# Patient Record
Sex: Male | Born: 1965 | Race: White | Hispanic: No | Marital: Married | Smoking: Never smoker
Health system: Southern US, Community
[De-identification: ages and names within clinical notes are randomized; demographics above are authoritative.]

## PROBLEM LIST (undated history)

## (undated) DIAGNOSIS — H9319 Tinnitus, unspecified ear: Secondary | ICD-10-CM

## (undated) HISTORY — PX: BACK SURGERY: SHX140

## (undated) HISTORY — PX: SHOULDER ARTHROSCOPY WITH LABRAL REPAIR: SHX5691

## (undated) HISTORY — PX: KNEE SURGERY: SHX244

## (undated) HISTORY — PX: SHOULDER SURGERY: SHX246

---

## 2002-12-15 ENCOUNTER — Emergency Department (HOSPITAL_COMMUNITY): Admission: EM | Admit: 2002-12-15 | Discharge: 2002-12-15 | Payer: Self-pay | Admitting: Emergency Medicine

## 2006-11-17 ENCOUNTER — Ambulatory Visit (HOSPITAL_COMMUNITY): Admission: RE | Admit: 2006-11-17 | Discharge: 2006-11-17 | Payer: Self-pay | Admitting: Internal Medicine

## 2007-07-19 HISTORY — PX: CHEST TUBE INSERTION: SHX231

## 2007-10-27 ENCOUNTER — Inpatient Hospital Stay (HOSPITAL_COMMUNITY): Admission: EM | Admit: 2007-10-27 | Discharge: 2007-11-07 | Payer: Self-pay | Admitting: Emergency Medicine

## 2007-10-27 ENCOUNTER — Ambulatory Visit: Payer: Self-pay | Admitting: Internal Medicine

## 2007-10-27 ENCOUNTER — Ambulatory Visit: Payer: Self-pay | Admitting: Pulmonary Disease

## 2007-10-29 ENCOUNTER — Ambulatory Visit: Payer: Self-pay | Admitting: Thoracic Surgery

## 2007-10-29 ENCOUNTER — Ambulatory Visit: Payer: Self-pay | Admitting: Critical Care Medicine

## 2007-10-30 ENCOUNTER — Encounter (INDEPENDENT_AMBULATORY_CARE_PROVIDER_SITE_OTHER): Payer: Self-pay | Admitting: Interventional Radiology

## 2007-11-08 ENCOUNTER — Inpatient Hospital Stay (HOSPITAL_COMMUNITY): Admission: AD | Admit: 2007-11-08 | Discharge: 2007-11-21 | Payer: Self-pay | Admitting: Internal Medicine

## 2007-11-10 ENCOUNTER — Encounter: Payer: Self-pay | Admitting: Thoracic Surgery

## 2007-11-28 ENCOUNTER — Ambulatory Visit: Payer: Self-pay | Admitting: Thoracic Surgery

## 2007-11-28 ENCOUNTER — Encounter: Admission: RE | Admit: 2007-11-28 | Discharge: 2007-11-28 | Payer: Self-pay | Admitting: Thoracic Surgery

## 2007-12-20 ENCOUNTER — Ambulatory Visit: Payer: Self-pay | Admitting: Thoracic Surgery

## 2007-12-20 ENCOUNTER — Encounter: Admission: RE | Admit: 2007-12-20 | Discharge: 2007-12-20 | Payer: Self-pay | Admitting: Thoracic Surgery

## 2008-01-17 ENCOUNTER — Encounter: Admission: RE | Admit: 2008-01-17 | Discharge: 2008-01-17 | Payer: Self-pay | Admitting: Thoracic Surgery

## 2008-01-17 ENCOUNTER — Ambulatory Visit: Payer: Self-pay | Admitting: Thoracic Surgery

## 2008-03-25 ENCOUNTER — Encounter: Admission: RE | Admit: 2008-03-25 | Discharge: 2008-03-25 | Payer: Self-pay | Admitting: Thoracic Surgery

## 2008-03-25 ENCOUNTER — Ambulatory Visit: Payer: Self-pay | Admitting: Thoracic Surgery

## 2008-11-09 IMAGING — CR DG CHEST 1V PORT
1 series · 1 of 1 positions shown · non-contrast
Comparison: 11/14/2007 study

CLINICAL DATA: Pneumonia

PORTABLE CHEST - 1 VIEW

[view not recorded]
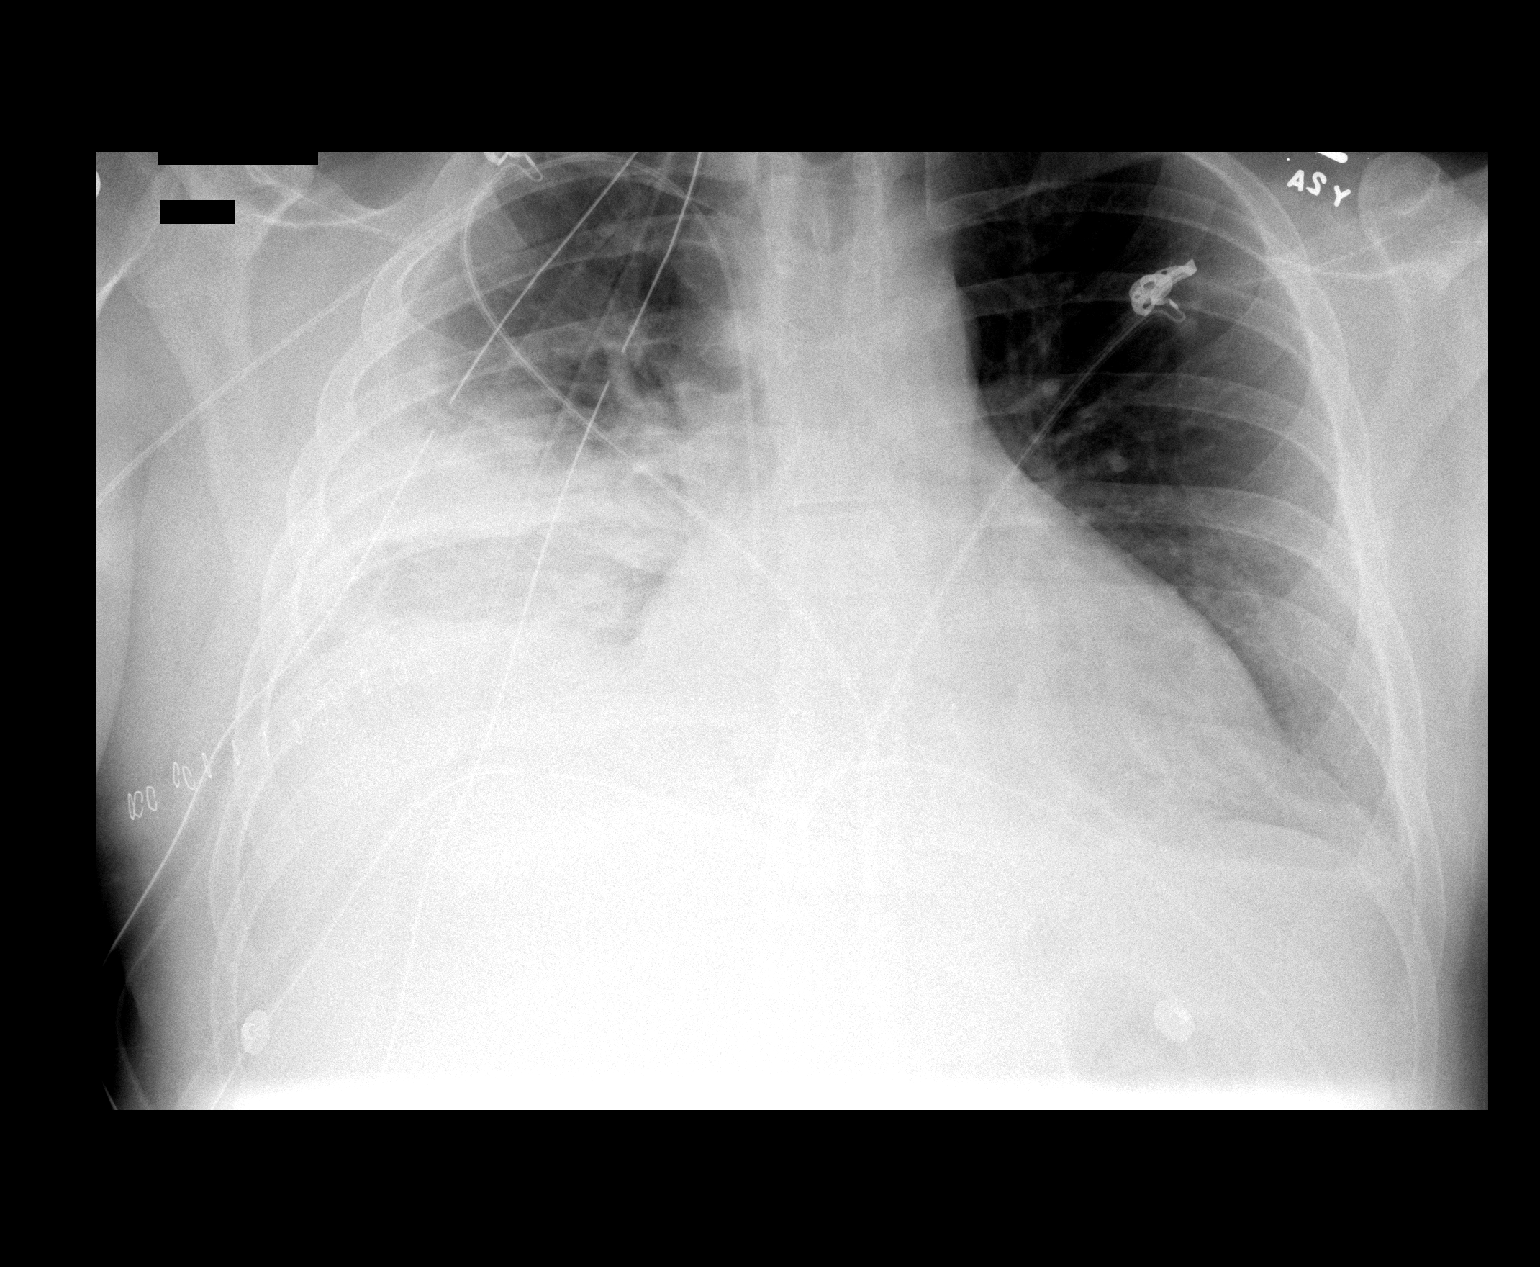

[1 of 1 positions shown; findings below may reference images not displayed]

FINDINGS: The right arm PICC line is in place with its tip in the
superior vena cava.
There are two right chest tubes in place..  No pneumothorax is
evident.  Atelectasis, airspace disease and associated pleural
density are again seen in the inferior right hemithorax.  There is
slightly more aeration of the right lung base than on the previous
study.

The left lung appears free of infiltrates.  There is moderate
enlargement of the cardiac silhouette.  Bones appear normal for
age.
IMPRESSION: There are two right chest tubes in place as well as the PICC line.
No pneumothorax is evident.

There is continued atelectasis with air space disease and opacity
and associated pleural density in the inferior right hemithorax.
There is slightly more aeration of the right lung base than on the
previous study.

## 2008-11-11 IMAGING — CR DG CHEST 1V PORT
1 series · 1 of 1 positions shown · non-contrast
Comparison: 11/16/2007

CLINICAL DATA: Pneumonia

PORTABLE CHEST - 1 VIEW

[view not recorded]
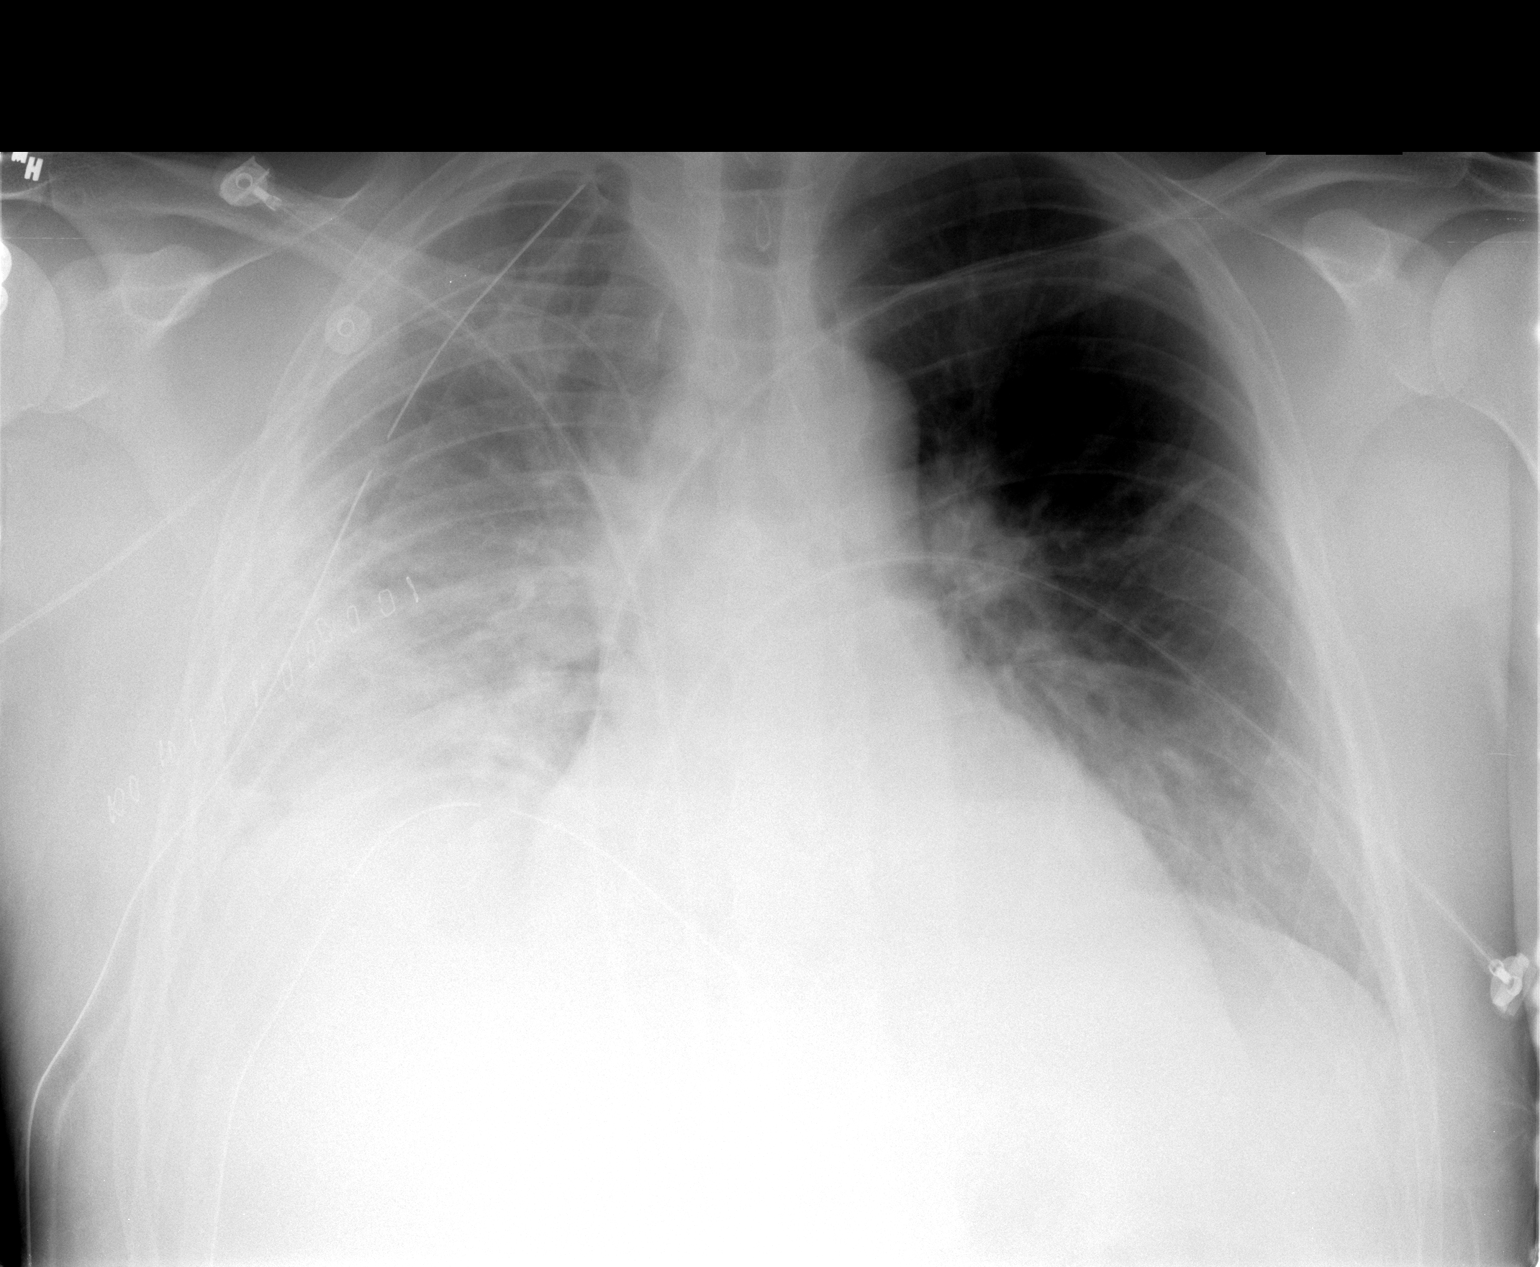

[1 of 1 positions shown; findings below may reference images not displayed]

FINDINGS: One of the right chest tubes has been removed with to
left in place.  The right PICC is stable.  Bilateral pulmonary
opacities and low lung volumes are stable.  The heart is normal in
size.  A tiny right apical pneumothorax is present.  It is less
than 5%.
IMPRESSION: One chest tube on the right has been removed and there is a tiny
right apical pneumothorax.

Bilateral pulmonary opacity is unchanged.

## 2008-11-12 IMAGING — CR DG CHEST 1V PORT
1 series · 1 of 1 positions shown · non-contrast
Comparison: 11/17/2007

CLINICAL DATA: Right lung pneumonia and status post thoracotomy for
empyema

PORTABLE CHEST - 1 VIEW

[view not recorded]
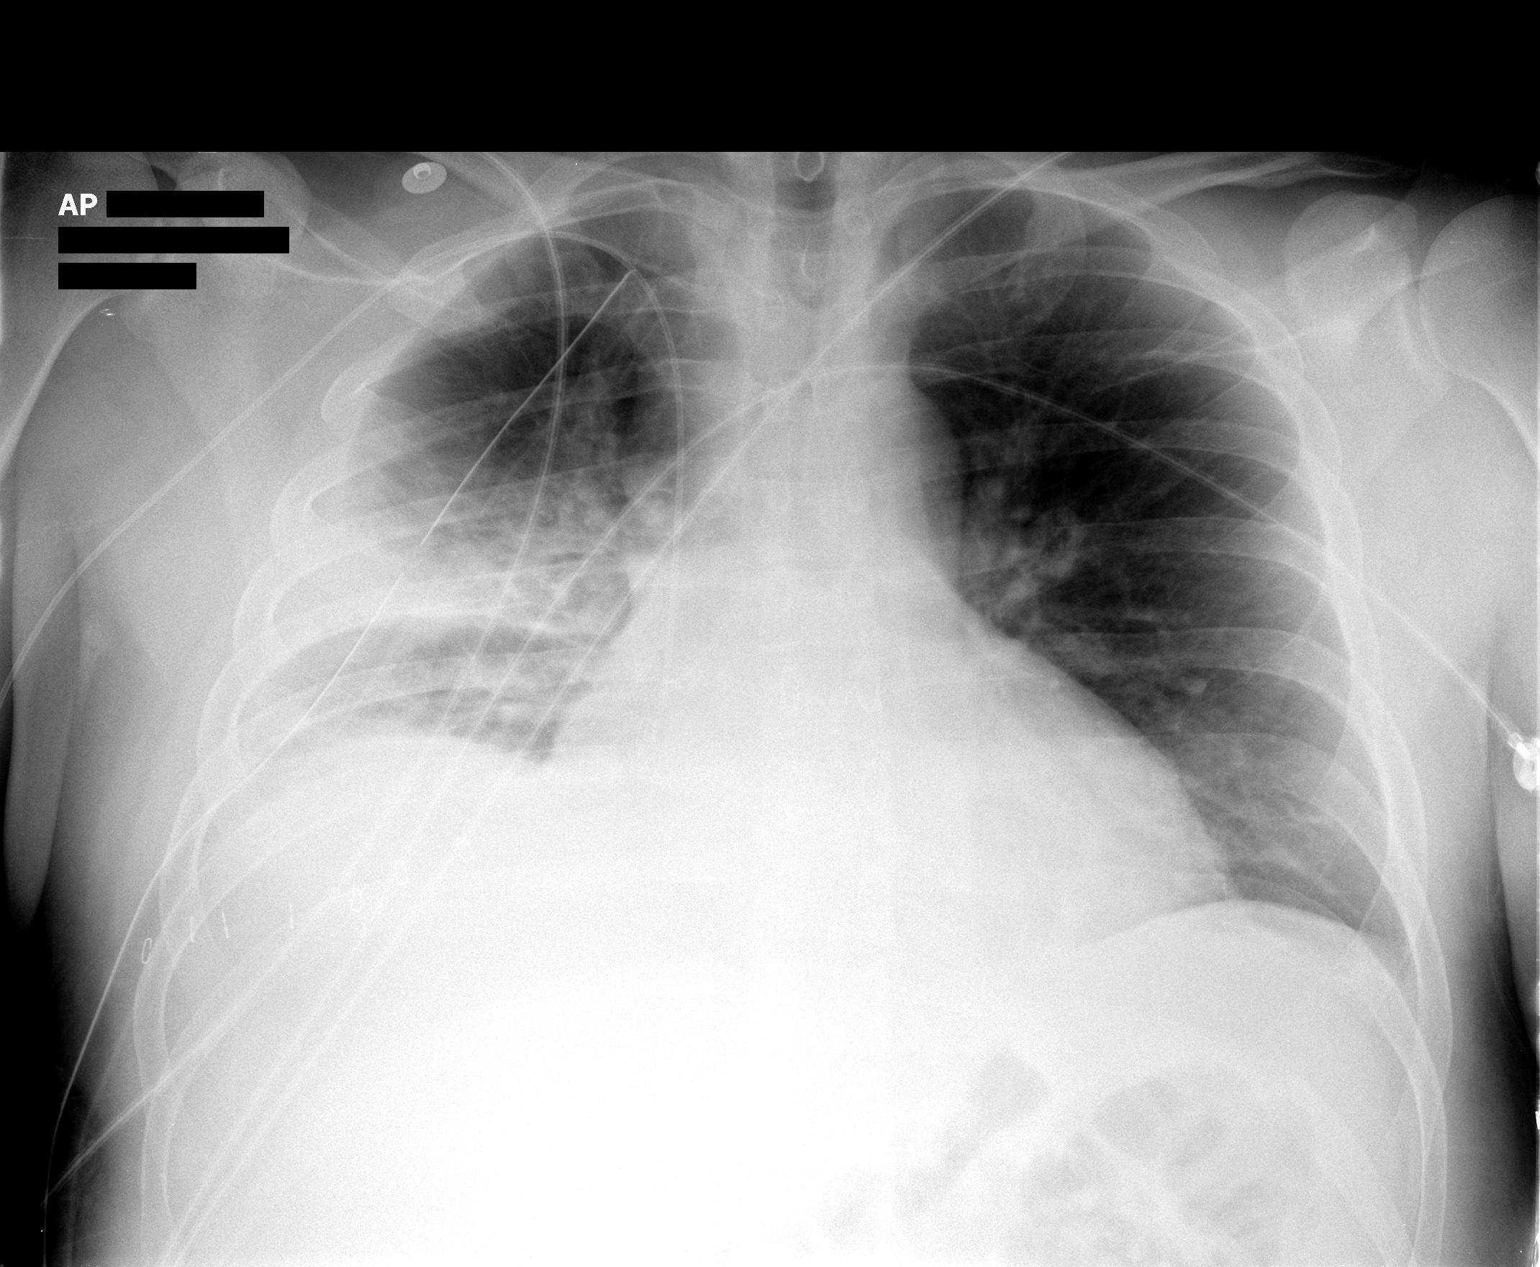

[1 of 1 positions shown; findings below may reference images not displayed]

FINDINGS: Single right chest tube remains in place.  No
pneumothorax visualized.  Overall aeration of the right lung has
improved substantially since the prior study.  There remains
component of pneumonia in the right mid lung field.  Stable heart
size.
IMPRESSION: Significant improvement in right lung aeration.  Residual right mid
lung infiltrate.  No pneumothorax

## 2008-11-14 IMAGING — CR DG CHEST 2V
2 series · 2 of 2 positions shown · non-contrast
Comparison: 11/19/2007

CLINICAL DATA: Weakness, shortness of breath

CHEST - 2 VIEW

[view not recorded (1 of 2)]
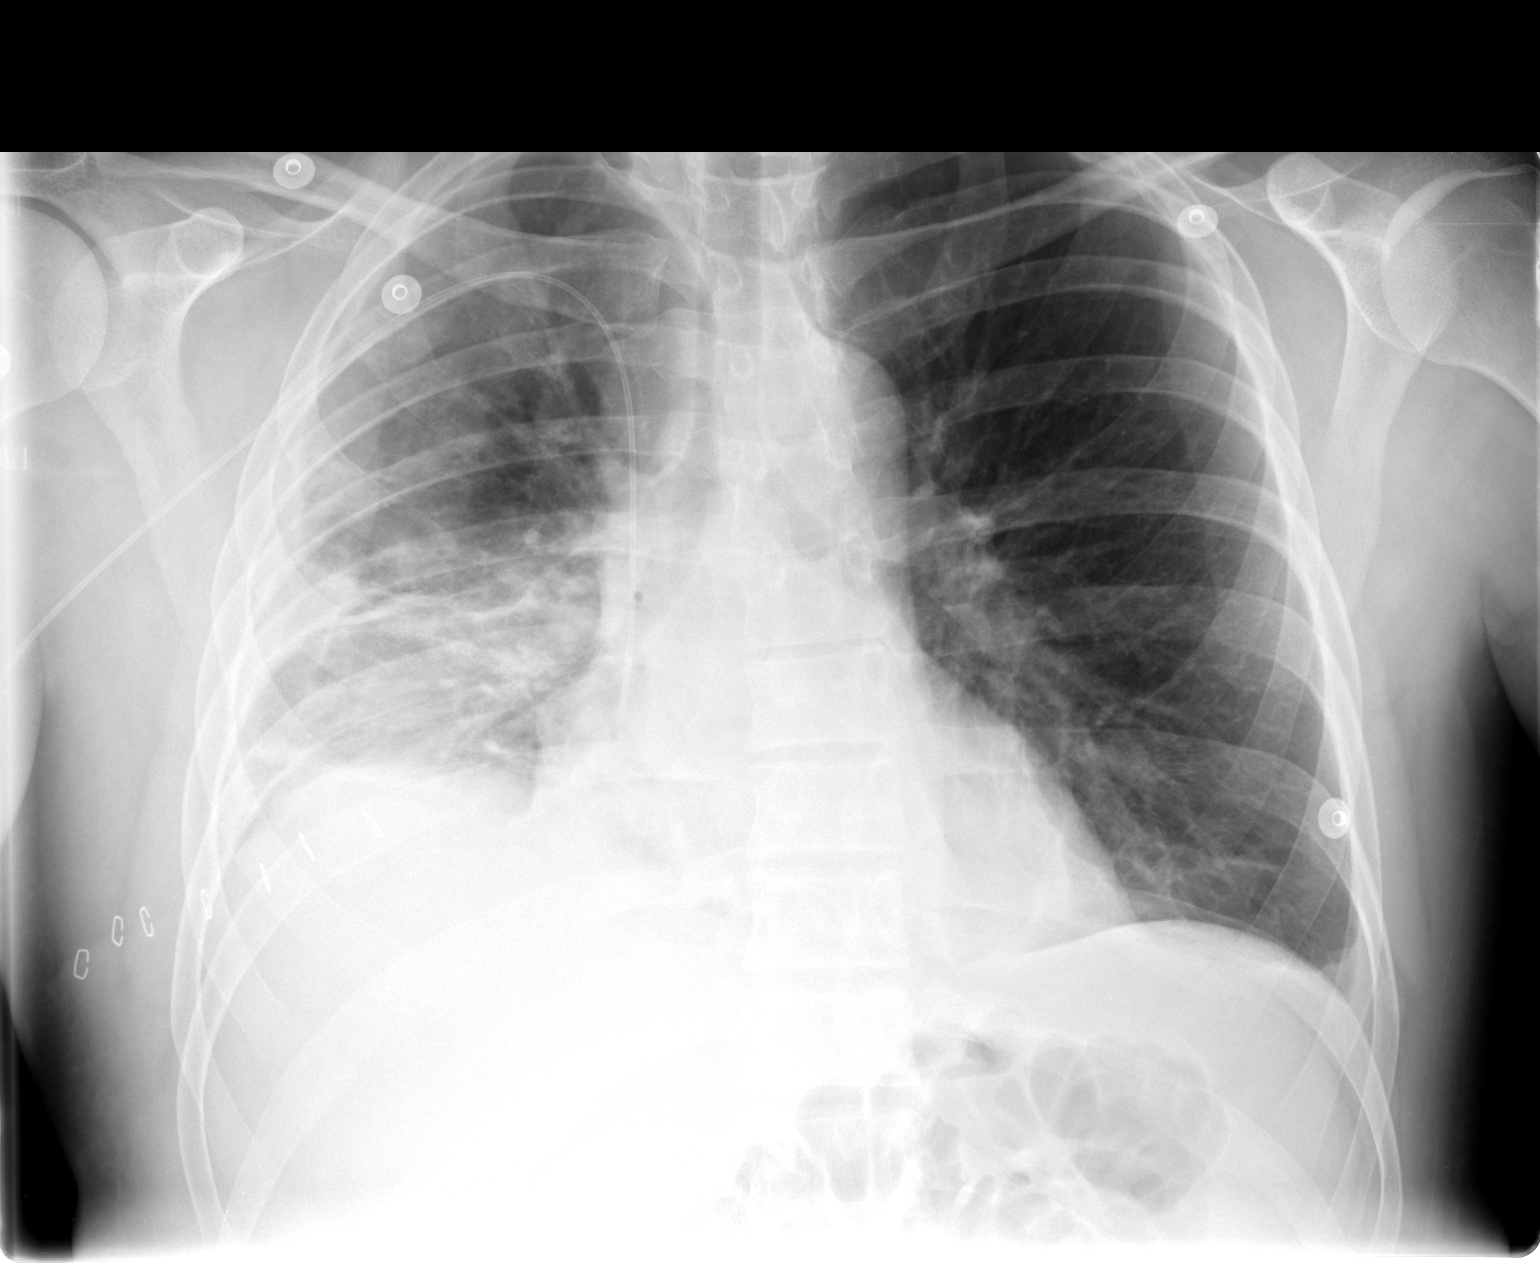

[view not recorded (2 of 2)]
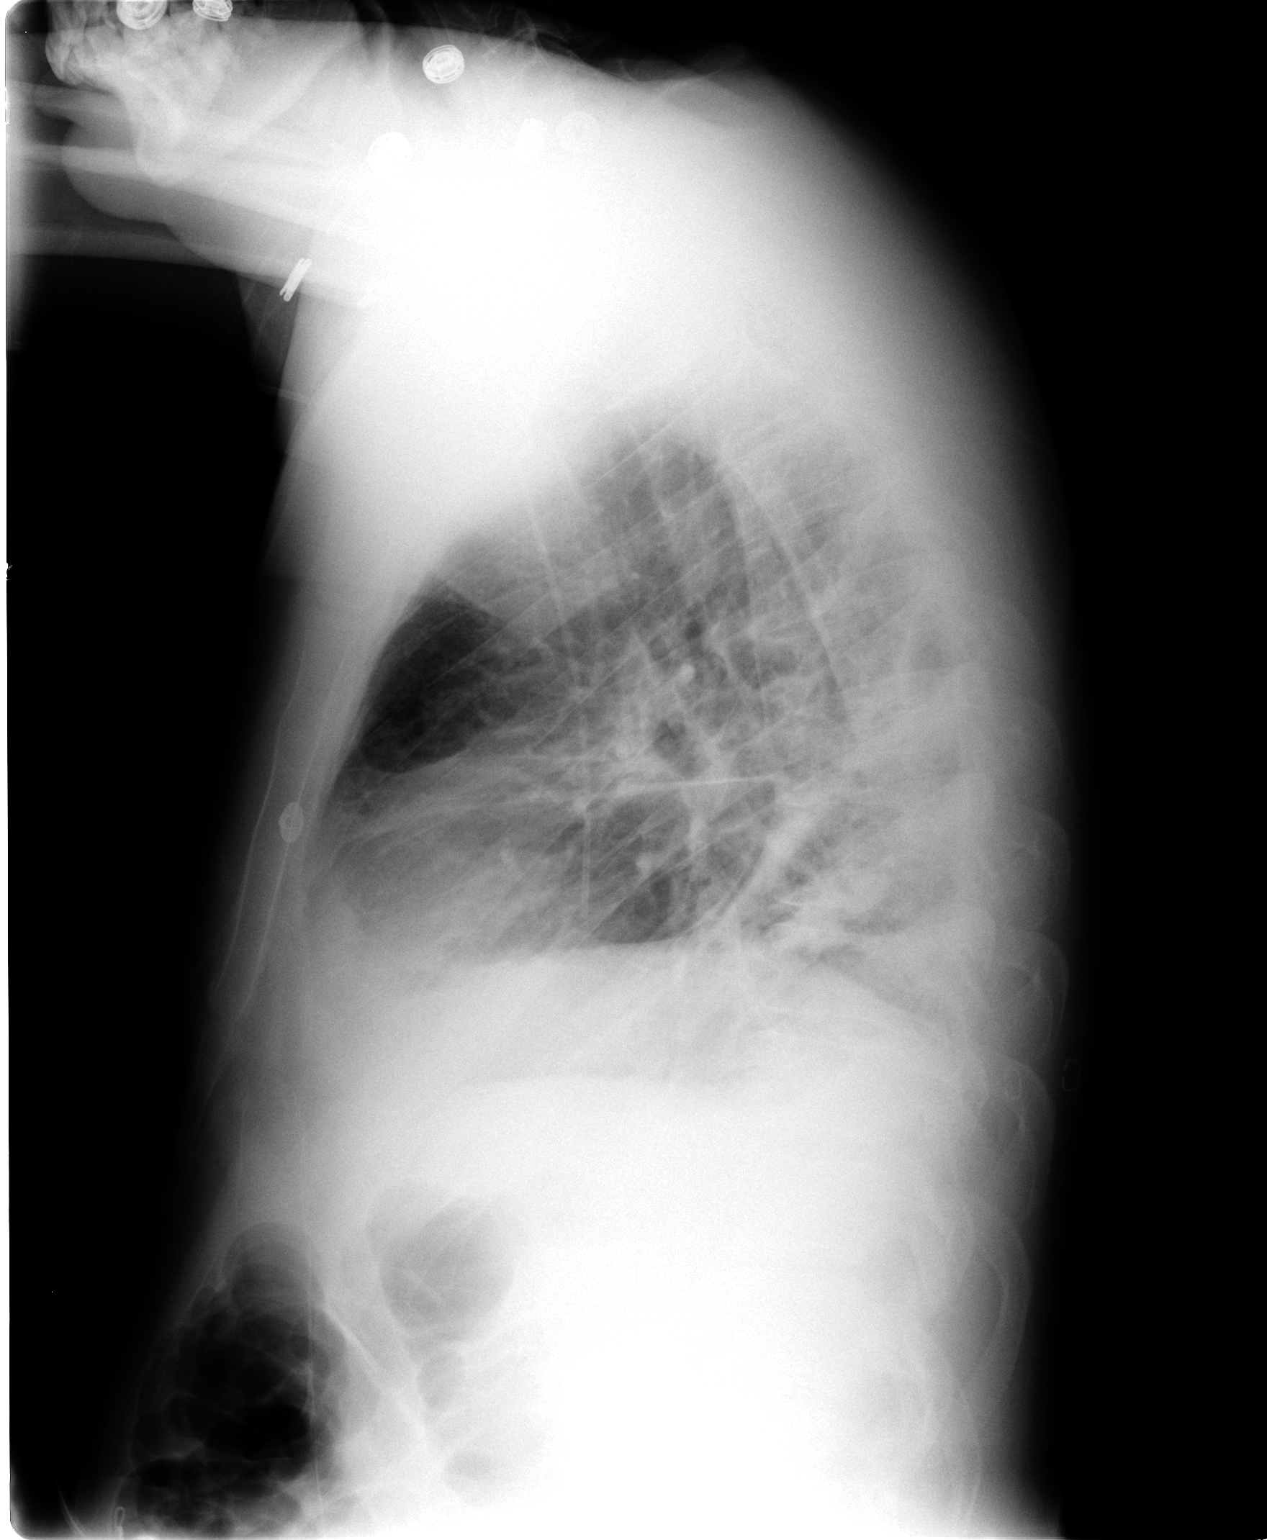

[2 of 2 positions shown; findings below may reference images not displayed]

FINDINGS: There is little change in pleural and parenchymal opacity
on the right with some volume loss on the right.  A small left
pleural effusion is present.  Heart size is stable.  Right central
venous catheter tip is unchanged with the tip in the lower SVC.
Only a small amount of right pleural air remains.
IMPRESSION: No significant change in pleural and parenchymal opacity on the
right with small amount of pleural air overlying  the right lung
apex.

## 2010-08-08 ENCOUNTER — Encounter: Payer: Self-pay | Admitting: Thoracic Surgery

## 2010-08-08 ENCOUNTER — Encounter: Payer: Self-pay | Admitting: Internal Medicine

## 2010-11-30 NOTE — H&P (Signed)
NAME:  ESTLE, HUGULEY        ACCOUNT NO.:  [id number]   MEDICAL RECORD NO.:  [id number]          PATIENT TYPE:  INP   LOCATION:  0112                         FACILITY:  [facility]   PHYSICIAN:  Tera Mater. Evlyn Kanner, M.D. DATE OF BIRTH:  22-Apr-1966   DATE OF ADMISSION:  10/27/2007  DATE OF DISCHARGE:                              HISTORY & PHYSICAL   HISTORY:  Mr. House is a healthy 45 year old white male who presents  to the emergency room for evaluation.  His story starts back when he was  ill on March 1 and saw Dr. Eric Form for difficulties with sinusitis.  He was treated with a course of Levaquin, and got mostly better.  His  right ear remained clogged up, however, and he saw a local ENT he  received 10-14 days of oral prednisone in a taper.  This did resolve in  complete resolution of his symptoms.  The patient was doing well in the  interim.  He did have a work-related trip to the [place] area, and  returned over the weekend, last weekend.  Sunday he felt less well than  usual, in sort of an ill/malaise kind of way, but nothing specific.  On  Monday he felt sick enough to miss work which is distinctly unusual, and  basically spent the day in bed with weakness and just malaise.   On Tuesday and Wednesday he developed more flu-like symptoms with aches,  pains, some fevers, some chills, and just generally feeling unwell and  stayed out of work both Tuesday and Wednesday as well.  On Thursday he  sought help at the local urgent care where he had previously been  treated as a patient, and was seen and apparently a flu swab was done  that was negative, and he was so treated for what they thought could be  oral thrush as a result of his prednisone therapy.  He was given  Tussionex, but his cough has gotten worse since that time.  Now he has  pleuritic pain.   Today, he has felt particularly worse.  He was weak with headache and  came to the emergency room for evaluation.  He has had the  travel to  [place], but no travel out of the country.  He has had no hunting trip, no  camping trips.  He had been on the soccer field some with his son, but  otherwise really has had no other difficulties.  His wife had had a  sinus infection.  His children have been well.  He has had no other  infectious like this.  His spleen is intact to his knowledge.  He  basically has no other past history of any sort.  At the present, he  does report his breath is a bit short.  His bowels have been working  fine with no nausea or diarrhea.  He just does still have some aches.  He did note some dysuria as well.   SOCIAL HISTORY:  The patient is married.  He works as an Neurosurgeon person.  He has two children.  He is a nonsmoker.   CURRENT MEDICATION  LIST INCLUDES:  Only the medicines given this week,  the Nystatin and Tussionex.  He takes nothing regularly.   ALLERGIES:  He has allergies to PENICILLINS which cause a rash.   PHYSICAL EXAMINATION:  VITAL SIGNS:  On exam today blood pressure is  126/88, pulse 100, respirations 18, temperature 97.8, O2 saturation 95%  on room air.  GENERAL:  We have a fairly well-appearing, well-nourished, white male  sitting up in no distress.  HEENT:  His sclerae are anicteric.  Fundus exam is intact with no  hemorrhages or lesions.  Tympanic membrane on the right is somewhat dull  and yellowish, but it is not really red.  The left side is clear.  Oral  mucous membranes are clear, and there is no thrush noted.  NECK:  Supple.  No thyromegaly or bruits are present.  LUNGS:  Reveal coarse dense rhonchi on the right with diminished breath  sounds.  There is a rub over there as well.  No wheezing is present.  HEART:  Regular and tachycardic with no murmur.  ABDOMEN: Soft, nontender.  I cannot feel any liver edge or any spleen  edge.  Distal pulses reveal strong pulses with no edema.  The patient is  awake.  He is alert.  His mentation is excellent.  Speech  is clear.  No  tremors present.  No skin rashes are present.   LAB DATA:  A chest x-ray shows opacity in the right lower lobe, right  middle lobe, possibly loculated effusion and pneumonia, but cannot  exclude empyema.  Other labs are markedly abnormal with a sodium 129,  potassium 3.7, chloride 89, CO2 22, BUN 43, creatinine 4.97, glucose  172.  Estimated GFR is 13, calcium is 7.7.  White count of 17,900,  hemoglobin 12.5, MCV 80.4, and platelets 250,000.   IN SUMMARY:  We have a 45 year old white male presenting with  significant acute illness with what appears to be a loculated pneumonia,  and perhaps full renal failure.  The BUN and creatinine suggest some  dehydration, but also possible renal process such as rhabdomyolysis and  a CPK is pending to look at this.  The potential for this patient to get  worse is very high, and broad-spectrum antibiotics will be given.  I  discussed the case briefly with Dr. Lina Sayre who is going to see  him.  He has already gotten Zithromax and Rocephin.  We had talked about  giving him Zosyn, but he has been PENICILLIN allergic, and I am going to  contact him back.  He is also getting vancomycin.  I do not see any  indication for coverage with fungal medicines.  We are going to give him  hydration.  We are going to recheck cultures.  We are going to recheck  an ultrasound of the kidneys to rule out hydronephrosis.  At the present  time, I do not see any reason to get renal involved.  This was all  discussed with the patient and his wife.  The patient also has had  abnormal blood sugars probably stress-induced, but ketones will be  checked.  I do not think that this is a presentation of new onset type 1  diabetes, but there is a small potential.  He will be covered with a  sliding scale testing just in case.  His labs will be followed  carefully.  He will be in step-down unit.  He will be monitored, and  supplemental oxygen will be given,  and we  will watch him very carefully.           ______________________________  Tera Mater Evlyn Kanner, M.D.     SAS/MEDQ  D:  10/27/2007  T:  10/27/2007  Job:  [id number]

## 2010-11-30 NOTE — Discharge Summary (Signed)
Nicholas Murray, Nicholas Murray        ACCOUNT NO.:  [id number]   MEDICAL RECORD NO.:  [id number]          PATIENT TYPE:  INP   LOCATION:  2037                         FACILITY:  [facility]   PHYSICIAN:  Kari Baars, M.D.  DATE OF BIRTH:  [birth year]   DATE OF ADMISSION:  11/08/2007  DATE OF DISCHARGE:  11/21/2007                               DISCHARGE SUMMARY   DISCHARGE DIAGNOSES:  1. Severe right community-acquired pneumonia with empyema, status post      VATS/decortication/right thoracotomy (November 10, 2007).  2. Urethral stricture, status post percutaneous suprapubic catheter      placement (November 10, 2007).  3. Peritonitis secondary to extravasated urine after suprapubic      catheter removal, status post exploratory laparotomy and      replacement of suprapubic catheter and urethral dilatation (November 15, 2007).  4. Anemia, multifactorial, secondary to blood loss and acute illness.  5. Acute renal failure secondary to acute tubular necrosis, resolved.  6. Leukopenia, resolved.  7. Malnutrition given severe protein calorie malnutrition.  8. Hyperlipidemia.  9. History of dysplastic moles.  10.Drug rash, likely secondary to fentanyl and morphine, resolved.   DISCHARGE MEDICATIONS:  1. Cipro 500 mg b.i.d. x6 days.  2. Zyvox 600 mg b.i.d. x6 days.  3. Nu-Iron 150 mg daily.  4. Flomax 0.4 mg nightly.  5. Vicodin 5/500 one to two q.6 hours p.r.n. pain (prescription given      last time).  6. Colace 100 mg b.i.d. p.r.n., stool softener.  7. MiraLax 17 g daily p.r.n., laxative.  8. Ambien 10 mg nightly p.r.n. insomnia.   HOSPITAL PROCEDURES:  1. CT of the chest without contrast (November 09, 2007), progressive      right lung pneumonia with increasing loculated effusion, most      consistent with empyema.  2. Right video-assisted thoracoscopic surgery, right thoracotomy, and      drainage of empyema with decortication (November 10, 2007, by Dr.      Karle Plumber).  3.  Cystoscopy and percutaneous suprapubic catheter insertion (November 10, 2007, by Dr. Larey Dresser).  4. Exploratory laparotomy with drainage of extraperitoneal      extravasated urine, exploration of bladder with antegrade      cystoscopy, and urethral dilatation and replacement of suprapubic      catheter (November 15, 2007, by Dr. Manus Rudd and Dr. Larey Dresser).  5. Renal ultrasound (November 15, 2007), moderate distention of urinary      bladder.  Normal size of the kidneys with no hydronephrosis or      hydroureter.  Fluid consistent with ascites in the right upper      quadrant and left lower quadrant.   HISTORY OF PRESENT ILLNESS:  For full details, please see dictated  history and physical by Dr. Eloise Harman.  Briefly, Nicholas Murray is a  previously healthy 45 year old white male who had a recent prolonged  hospitalization for severe right-sided pneumonia.  He presented  initially on October 27, 2007, with a 5-day history of flu-like symptoms  including myalgias, fever, cough, and  dyspnea.  He had temperature up to  103.  At that time, he was admitted to the ICU with right-sided severe  pneumonia and acute renal failure.  That hospitalization involved an  extensive evaluation for his renal failure including renal biopsy, which  was consistent with ATN.  He was treated with empiric vancomycin and  Avelox for his community-acquired pneumonia with clinical improvement.  He had improvement in his fever, shortness of breath, and cough.  However, his chest x-ray continued to show significant right-sided  infiltrate.  Serial CT scan showed improvement of the effusions, and  after extensive discussion with pulmonology, CT surgery, and infectious  disease, it was felt reasonable to discharge him home on IV vancomycin,  p.o. Avelox, with close outpatient followup for these effusions.  Unfortunately, after discharge the patient developed a temperature of  103 and was readmitted for  further evaluation.   HOSPITAL COURSE:  The patient was admitted to a medical bed.  Lateral  decubitus chest x-ray showed persistent loculated pleural effusions.  A  CT scan was obtained that showed progressive increase in the size of the  effusions.  Dr. Edwyna Shell reviewed the scans and felt that VATS with  decortication was necessary, and on November 10, 2007, he was taken to the  operating room for this purpose.  Intraoperative evaluation showed three  discrete loculated collections consistent with empyema with significant  inflammation and pleural thickening.  The VATS was converted to an open  thoracotomy with extensive decortication and three chest tube placement.  He tolerated this procedure well and the chest tubes were subsequently  pulled several days prior to admission with no significant pneumothorax  or complications.  Chest x-ray continued to show improvement in the  right-sided opacity and effusions.  Clinically, he has improved with  resolution of his cough, fever, and leukocytosis.   The patient was initially continued on vancomycin and Avelox for empiric  coverage of both MRSA and community-acquired organisms.  Infectious  disease was involved in his care.  However, postoperatively, he  developed a significant drug rash.  Due to the concern that this may  have been antibiotic related, his vancomycin was changed to Zyvox.  His  Avelox was initially changed to azithromycin.  This was ultimately  switched to Cipro on Nov 16, 2007, to cover both pulmonary and urinary  sources of infection.  He has continued to do well on this antibiotic  regimen.  In hindsight, his rash was likely due to the fentanyl and  morphine PCA and improved with change to Dilaudid.  His rash has  completely resolved at this time.  From an infectious disease  standpoint, he does appear to be improving.   While in the operating room for his VATS/thoracotomy, Foley catheter  placement was attempted.  This  was met with great resistance and urology  was consulted.  Cystoscopy was performed that showed a tight urethral  stricture.  Therefore, suprapubic catheter was placed in the operating  room.  On postoperative day 3, a voiding trial was attempted and the  suprapubic catheter was subsequently removed on November 14, 2007.  On that  evening and the following morning, he complained of severe acute lower  abdominal discomfort and difficulty urinating.  A bedside bladder scan  and a renal ultrasound did not show any significant urinary retention.  General surgery was consulted, and due to his acute abdomen, he was  taken emergently to the operating room for exploratory laparotomy.  This  did  show extensive urinary peritonitis due to a leak from the suprapubic  catheter site.  Dr. Vonita Moss and Dr. Corliss Skains performed this procedure and  Dr. Vonita Moss did an antegrade cystoscopy and urethral dilatation and the  suprapubic catheter was replaced.  A suprapubic catheter was also placed  following dilatation of the urethral stricture.  He had excellent  urinary output through both tubes following this procedure with rapid  resolution of his severe abdominal pain.  On the day prior to discharge,  his suprapubic catheter was plugged and both will remain in place for  outpatient followup with Dr. Vonita Moss.   At this point, the patient's pneumonia and empyemas have improved  significantly with continued improvement in his clinical appearance.  He  continues to have mild pleuritic-type chest pain related to his surgery.  His urinary issues have stabilized and outpatient followup has been  arranged.  He has remained afebrile and is tolerating full diet and  ambulating without complication.  At this point, he is stable for  discharge to home with very close outpatient followup.   DISCHARGE LABS:  On Nov 20, 2007, CBC showed a white count of 7.1,  hemoglobin 9.2, and platelets 661.  BMET significant for sodium  137,  potassium 3.5, chloride 100, bicarb 29, BUN 7, creatinine 1.2, and  glucose 98.   DISCHARGE INSTRUCTIONS:  He is instructed to call if he has increasing  pain, temperature above 101.5, worsening shortness of breath or cough.   HOSPITAL FOLLOWUP:  1. He will follow up with Dr. Vonita Moss for suprapubic catheter removal      on Nov 20, 2007.  He was instructed to remove his Foley catheter at      home on Nov 23, 2007, and to call with any complications.  2. He will follow up with Dr. Clelia Croft on Nov 27, 2007, and should have a      repeat CBC at that time.  3. He will follow up with Dr. Edwyna Shell on Nov 28, 2007, with a chest x-      ray prior to the visit.   DISCHARGE DIET:  No restrictions.   DISPOSITION:  To home.      Kari Baars, M.D.  Electronically Signed     WS/MEDQ  D:  11/21/2007  T:  11/21/2007  Job:  [id number]   cc:   Ines Bloomer, M.D.  Maretta Bees. Vonita Moss, M.D.  Cliffton Asters, M.D.

## 2010-11-30 NOTE — Op Note (Signed)
NAME:  Nicholas Murray, Nicholas Murray        ACCOUNT NO.:  [id number]   MEDICAL RECORD NO.:  [id number]          PATIENT TYPE:  INP   LOCATION:  3308                         FACILITY:  [facility]   PHYSICIAN:  Maretta Bees. Vonita Moss, M.D.DATE OF BIRTH:  29-Apr-1966   DATE OF PROCEDURE:  DATE OF DISCHARGE:                               OPERATIVE REPORT   PREOPERATIVE DIAGNOSES:  Urethral stricture and acute abdomen, rule out  urinary extravasation versus intraperitoneal disease.   POSTOPERATIVE DIAGNOSES:  Severe urethral stricture and extraperitoneal  extravasation of urine.   SURGEON:  Maretta Bees. Vonita Moss, MD   ASSISTANT:  Wilmon Arms. Tsuei, MD   ANESTHESIA:  General.   PROCEDURE:  Exploratory laparotomy and drainage of extraperitoneal  extravasated urine, exploration of bladder with antegrade cystoscopy and  urethral dilation and placement of suprapubic tube.   INDICATIONS:  This 45 year old gentleman has had significant pulmonary  disease and infection and underwent a thoracotomy last weekend and a  Foley catheter could not be inserted by Dr. Edwyna Shell and the staff, so I  was called to see him and cystoscoped him and found a false passage, I  could not get in a catheter from below, so I put in a percutaneous  suprapubic tube.  That catheter drained nicely for the last few days and  yesterday, he had a voiding trial and voided well with no significant  residual urine and his SP tube was removed.  This morning, I came by to  see him and he said he could not void and had a lot of pain and  pressure.  Bladder scans and bladder ultrasound revealed a partially  distended bladder, but he had a tender abdomen worrisome for  intraperitoneal disease or some other type of problems above and beyond  the distended bladder.  He was seen by Dr. Corliss Skains, and a KUB showed no  intraperitoneal air, but it was felt that exploration was necessary.   PROCEDURE:  The patient was brought to the operating room, placed in  supine position and lower abdominal vertical incision was opened up by  Dr. Corliss Skains and he dictated an intraperitoneal exploration finding no  disease, but in the meantime, the patient had a tremendous amount of  extraperitoneal suprapubic urine that was drained.  The bladder was  distended and was opened up in the midline.  There was no evidence of  any damage to the bladder and the puncture site could not even be  visualized.  There were no posterior bladder injuries.  I then used a  flexible cystoscope antegrade from above and went through the bladder  neck and prostate and into the deep bulbous urethra where I saw a  stricture.  I passed a Glidewire through the strictured opening, and  over the Glidewire, I passed an open-ended ureteral catheter and then  inserted a metal guidewire over which I dilated in the usual fashion  from the tip of the penis back towards the bladder using Heyman dilators  and I dilated him to 26-French.  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  Then, I used a 16-French Council catheter to drain the bladder, which  was  easily placed in the bladder over the indwelling guidewire, which  was then removed.  I then made a stab wound in the left side of the  bladder through which a 22-French, 5 mL Foley was used for a suprapubic  tube.  A pursestring suture was placed around the bladder at the  suprapubic tube entry site.  The bladder was then closed with running 0-  Vicryl and then imbricated with some 0-chromic catgut.  The SP tube was  then brought up to the abdominal wall and chromic catgut placed between  the bladder and the back of the abdominal wall to hold it in place.  Through the stab wound to the right of the incision, a Blake drain was  placed in the prevesical space for postoperative drainage.  At this  point,  bladder irrigation was clear, and Dr. Corliss Skains closed the abdominal wound,  dictated separately.  Both the suprapubic tube and Blake drains were   sutured in place with black silk.  The patient tolerated the procedure  well and there was no significant blood loss, and sponge, needle,  instrument count was correct.      Maretta Bees. Vonita Moss, M.D.  Electronically Signed     LJP/MEDQ  D:  11/15/2007  T:  11/16/2007  Job:  [id number]   cc:   Kari Baars, M.D.  Ines Bloomer, M.D.  Wilmon Arms. Tsuei, M.D.

## 2010-11-30 NOTE — Letter (Signed)
January 17, 2008   Kari Baars, M.D.  [address]  [place], [place] [address]   Re:  Nicholas Murray, Nicholas Murray                DOB:  [birth year]   Dear Gala Romney:   I saw the patient back today.  He is doing remarkably well.  He is  gaining the strength.  His blood pressure was 123/81, pulse is 90,  respirations 18, and sats were 98%.  His incision is well-healed.  Chest  x-ray did show a small amount of reaction on the right side.  He does  have some limitation in motion in his right shoulder and some weakness.  So, I think he needs to have some strengthening exercises for that.  He  thought his personal trainer could do it.  If not, I will need to refer  him for a formal physical therapy.  He should be able to slowly build  his strength back up after his surgery.  I will see him back in 2 months  with another chest x-ray.   Ines Bloomer, M.D.  Electronically Signed   DPB/MEDQ  D:  01/17/2008  T:  01/18/2008  Job:  [id number]

## 2010-11-30 NOTE — Consult Note (Signed)
NAME:  Nicholas Murray, Nicholas Murray        ACCOUNT NO.:  [id number]   MEDICAL RECORD NO.:  [id number]          PATIENT TYPE:  INP   LOCATION:  2308                         FACILITY:  [facility]   PHYSICIAN:  Maretta Bees. Vonita Moss, M.D.DATE OF BIRTH:  08-13-1965   DATE OF CONSULTATION:  11/10/2007  DATE OF DISCHARGE:                                 CONSULTATION   I was asked by Dr. Edwyna Shell to see this gentleman in regards to  intraoperative problems with inserting the Foley catheter.  This  gentleman is an otherwise healthy 45 year old gentleman who had  extensive right-sided pneumonia and pneumonia has developed significant  consolidation in his right lung and was scheduled for surgical  exploration and drainage of this area by Dr. Edwyna Shell who ended up  draining empyema and decorticating the lung.  This gentleman apparently  had no history of prostatitis or particular voiding problems.   He has been treated in the past for hyperlipidemia and dysplastic moles.   MEDICATIONS PRIOR TO HOSPITALIZATION:  Vancomycin, Avelox, Tessalon  Perles, Vicodin, Mucinex, hydrocodone syrup, albuterol, Colax, and  MiraLax.   ALLERGIES:  He is allergic to PENICILLIN.   He has had a wrist fracture x2.   FAMILY HISTORY:  His father has a family history of prostate cancer.   REVIEW OF SYSTEMS:  As noted in the hospital chart.   PHYSICAL EXAMINATION:  VITAL SIGNS:  As noted per anesthesia record.  The patient on examination was asleep.  ABDOMEN:  Soft and flat.  EXTERNAL GENITALIA:  Revealed normal penis, urethral meatus, scrotum,  testicles, and epididymis except for some blood per urethral meatus.  Perineum and anal sphincter tone was normal.  Prostate felt small and  benign.  No seminal vesicle tissue palpated.   IMPRESSION:  Inability to pass a Foley catheter.   PLAN:  We will plan to cystoscope him, and that note is dictated  separately.      Maretta Bees. Vonita Moss, M.D.  Electronically Signed     LJP/MEDQ  D:  11/10/2007  T:  11/10/2007  Job:  [id number]   cc:   Ines Bloomer, M.D.  Kari Baars, M.D.

## 2010-11-30 NOTE — Consult Note (Signed)
NAME:  Nicholas Murray, Nicholas Murray        ACCOUNT NO.:  [id number]   MEDICAL RECORD NO.:  [id number]          PATIENT TYPE:  INP   LOCATION:  2928                         FACILITY:  [facility]   PHYSICIAN:  Charlcie Cradle. Delford Field, MD, FCCPDATE OF BIRTH:  [birth year]-Sep-1965   DATE OF CONSULTATION:  10/29/2007  DATE OF DISCHARGE:                                 CONSULTATION   CHIEF COMPLAINT:  Evaluate for right lower lobe pneumonia and pleural  effusion.   HISTORY OF PRESENT ILLNESS:  This is a 45 year old male initially was  ill on September 16, 2007, when he had apparently acute sinusitis type  symptoms, was treated with a course of Levaquin seemed to get somewhat  better, but then had right ear stuffiness.  He saw ENT, received a 14-  day course of oral prednisone with taper.  This improved his ear  symptoms and seemed to be doing well, and then went on work-related trip  in end of March for 1 week returning over the first weekend of April.  Throughout last week, he had some malaise and illness, but tried to  pursue his regular activities, although he did stay home from work last  week, spent a day in bed, and he tried to move around a bit more last  week developed flu-like illnesses with aches and pains, fever, chills,  stayed out of work last week as well.  On [facility], [facility], was seen previously as the patient had a flu swab that was  negative, given Tussionex p.r.n.  Then he developed on Friday increasing  right-sided pleuritic chest pain, got worse on Saturday, the October 27, 2007, came to the emergency room and was treated and admitted.  With his  travel history, he has not had any significant exposure history.  He has  been playing a lot of athletics, but recently with basketball and  soccer.  He states his breathing is worse with deep breath.  He has had  excess cough, which has been dry and nonproductive.  His symptoms have  progressed to the point where he was admitted through the  emergency  room.  He was also noted to have acute renal failure with creatinine  elevated and elevated potassium along with metabolic acidosis.  He is  now transferred to [facility] from [facility] for further inpatient  evaluation.  He does have loculated pleural effusions on chest CT.   PAST MEDICAL HISTORY:  Medical essentially noncontributory.   MEDICATIONS:  He has been recently on nystatin, Tussionex, and  ibuprofen.   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  He is married with 2 children.  He  is a nonsmoker.   ALLERGIES:  Penicillin causes a rash.   REVIEW OF SYSTEMS:  He has had myalgias, fever, headaches, chills, dry  cough, and tea-colored urine.  No excess bleeding or bruising.  Some  nausea.   MEDICATIONS UPON TRANSFER:  Actually transferred to [facility] from [facility] : [facility].  His medications upon transfer have included sliding-scale  insulin, vancomycin IV, Protonix IV, Cipro  IV, Zithromax IV, and Solu-  Medrol 500 mg daily.   CURRENT PHYSICAL EXAMINATION:  VITAL SIGNS:  T-max was 98, blood  pressure was 130/80, pulse 80, respirations 20, and saturation 97% on  nasal cannula.  CHEST:  Showed consolidative changes in the right lower lung zone.  Dullness to percussion anteriorly and posteriorly.  CARDIAC:  Regular rate and rhythm.  No murmur or gallop.  ABDOMEN:  Soft and nontender.  Bowel sounds active.  EXTREMITIES:  No clubbing, edema, or venous disease.   LABORATORY DATA:  Urine culture on October 27, 2007, is negative.  Blood  cultures October 27, 2007, is negative.  Blood ketones negative.  C3-C4  unremarkable.  HIV negative.  Renal ultrasound echogenic kidneys  suggesting chronic issues.  CT of chest, consolidated right lower lobe  and right upper lobe with left medial loculation and pleural fluid on  the right as well.  Chest x-ray also shows the same changes.  Sodium  131, potassium 5.8, chloride  102, CO2 70, creatinine 5.8, blood sugar  135, hemoglobin 11.7, platelet count 276, white count 69, calcium 6.9,  phosphorus 4.5, total protein 5.1, and albumin 1.6.   IMPRESSION:  1. Right lower lobe consolidative pneumonia, likely severe community-      acquired pneumonia.  No organisms specified now likely has empyema      versus parapneumonic effusion with loculation.  2. Acute renal failure with metabolic acidosis with associated either      decreased volume, rule out rhabdomyolysis, rule out acute tubular      necrosis, rule out primary glomerulonephritis, and rule out      Wegener's granulomatosis with history of sinus disease.   RECOMMENDATIONS:  Check CT scan of the sinus, check urine for myoglobin,  thoracic surgery consult for video-assisted thoracoscopic drainage of  right pleural effusion, plus or minus open lung biopsy, and antibiotics  per infectious disease service.      Charlcie Cradle Delford Field, MD, [facility]  Electronically Signed     PEW/MEDQ  D:  10/29/2007  T:  10/30/2007  Job:  [id number]   cc:   Garnetta Buddy, M.D.  Cliffton Asters, M.D.  Kari Baars, M.D.  Jordan Hawks Edwyna Shell, M.D.

## 2010-11-30 NOTE — H&P (Signed)
NAME:  Nicholas Murray, Nicholas Murray        ACCOUNT NO.:  [id number]   MEDICAL RECORD NO.:  [id number]          PATIENT TYPE:  INP   LOCATION:  6702                         FACILITY:  [facility]   PHYSICIAN:  Barry Dienes. Eloise Harman, M.D.DATE OF BIRTH:  Nov 20, 1965   DATE OF ADMISSION:  11/08/2007  DATE OF DISCHARGE:                              HISTORY & PHYSICAL   CHIEF COMPLAINT:  Persistent fever.   HISTORY OF PRESENT ILLNESS:  The patient is a 45 year old white man  known to me from his recent hospitalization.  [facility] a somewhat prolonged  hospital stay for continued home treatment of an extensive right-sided  pneumonia.  Since discharge, he had been eating and drinking well.  He  continued to have a dry cough with mild right pleuritic chest pain.  He  had been walking short distances without difficulty.  He denied calf  pain, diarrhea, dysuria, or frequency.  Today, the visiting home nurse  noted his temperature was 103 degrees Fahrenheit when she arrived to  give vancomycin IV.  He was directly admitted to the hospital for  further evaluation.   PAST MEDICAL HISTORY:  1. Hyperlipidemia.  2. Dysplastic moles.   MEDICATIONS WITH PRIOR TO REHOSPITALIZATION:  1. Vancomycin 1250 mg IV daily for the next 11 days.  2. Avelox 400 mg p.o. daily for the next 12 days.  3. Tessalon Perles 100 mg p.o. every 8 hours p.r.n. cough.  4. Vicodin 5/500 one tablet p.o. q.6 h. p.r.n. moderate to severe      pain.  5. Mucinex DM one tablet twice daily p.r.n. cough.  6. Hycodan syrup 1 teaspoon p.o. nightly. p.r.n. cough.  7. Albuterol HFA 2 puffs every 6 hours p.r.n. wheezing.  8. Colace 100 mg 1-2 tablets daily p.r.n. constipation.  9. MiraLax 17 g daily p.r.n. constipation.   ALLERGIES:  PENICILLIN as a child caused a rash.   PAST SURGICAL HISTORY:  Left wrist fracture x2.   FAMILY HISTORY:  His father has hyperlipidemia and prostate cancer.   His  mother is healthy.  He has a son and two daughters.   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  He has no history of tobacco or alcohol abuse.  He  has one son age 39 years and two 62-year-old twins.   REVIEW OF SYSTEMS:  See history of present illness.   INITIAL PHYSICAL EXAM:  VITAL SIGNS:  Blood pressure 122/74, pulse 108,  respirations 18, temperature 100.2 degrees Fahrenheit, pulse oxygen  saturation 91% on room air.  GENERAL:  He is a well-nourished, well-developed white male who had an  occasional dry cough but no shortness of breath on room air.  HEENT:  Within normal limits.  NECK:  Supple without jugular venous distention or carotid bruit.  CHEST:  Crackles in the mid to upper portion of the right hemithorax and  there are decreased breath sounds in both bases.  HEART:  Regular rate and rhythm without significant murmur or gallop.  ABDOMEN:  Normal bowel sounds and no hepatosplenomegaly or tenderness.  EXTREMITIES:  Bilateral 1+ ankle edema with negative Homans signs and  normal pedal pulses.  NEUROLOGICAL:  He was alert and well-oriented with normal affect and no  focal neurologic deficits.   Initial lab studies and chest x-ray findings were pending at the time of  dictation.  His most recent hospital laboratory tests included serum  sodium 135, potassium 4.0, chloride 97, carbon dioxide 27, BUN 17,  creatinine 1.69, glucose 112, white blood cells 15.7, hemoglobin 10,  hematocrit 29, platelets 435, total protein 4.3, albumin 1.7.  Chest x-  ray had right lower lobe pneumonia with a loculated right pleural  effusion and small left pleural effusion.   IMPRESSION AND PLAN:  1. Fever and cough:  It is most likely that he has persistent fevers      due to a loculated infection, empyema.  It is less likely that his      fever is due to a resistant infection, an antibiotic reaction, or      an occult deep venous thrombosis.   I plan to continue IV vancomycin      with Avelox.  We will check bilateral decubitus chest x-rays to see      what layering there is of the pleural effusions.  I have requested      an ultrasound-guided thoracentesis on the right for diagnosis and      treatment.  The rationale of this approach was explained to the      patient and his wife.  2. Malnutrition:  Moderately severe is indicated by his low serum      albumin level.  Of course, this could partially represent an acute      phase reaction to sepsis syndrome.  If his appetite does not remain      reasonable with hospital food, he may need nutrition supplements      and multivitamins.  3. Acute renal insufficiency:  This was secondary to sepsis and had      been improving with resumption of normal renal perfusion.  We will      continue moderate dose intravenous fluids and avoid nephrotoxins.           ______________________________  Barry Dienes Eloise Harman, M.D.     DGP/MEDQ  D:  11/08/2007  T:  11/08/2007  Job:  [id number]   cc:   Dr. Martha Clan

## 2010-11-30 NOTE — Op Note (Signed)
NAME:  Nicholas Murray, Nicholas Murray        ACCOUNT NO.:  [id number]   MEDICAL RECORD NO.:  [id number]          PATIENT TYPE:  INP   LOCATION:  2308                         FACILITY:  [facility]   PHYSICIAN:  Maretta Bees. Vonita Moss, M.D.DATE OF BIRTH:  [birth year]   DATE OF PROCEDURE:  11/10/2007  DATE OF DISCHARGE:                               OPERATIVE REPORT   PREOPERATIVE DIAGNOSIS:  Inability to pass urethral catheter.   POSTOPERATIVE DIAGNOSES:  1. Inability to pass urethral catheter.  2. False passage.   PROCEDURE:  Cystoscopy and percutaneous suprapubic tube insertion.   SURGEON:  Maretta Bees. Vonita Moss, MD   ANESTHESIA:  General.   INDICATIONS:  As per my consult note, I was called to insert a Foley  catheter before his lung surgery.  The staff and Dr. Edwyna Shell could not  get a catheter inserted.   PROCEDURE IN DETAIL:  The patient was asleep and the external genitalia  prepped and I used a flexible cystoscope and the anterior urethra was  normal until I got to the external sphincter area.  At this point, at 12  o'clock, there appeared to be a continuation of this mucosa but it ended  up with that area being blind ending.  Posteriorly, there was a blood  and typical false passage with filmy tissue such as seen in the corpus  spongiosum.  There was no definitive tract or obvious urethral opening.  Under visual control, I used the Glidewire and it would not pass beyond  this anterior blind-appearing area and gentle probing of the posterior  part of the urethra revealed and demonstrated inability to pass the  Glidewire into the bladder.  It is my impression that there was a  mucosal flap that is obstructing passage of the Glidewire or  visualization.  I felt it was appropriate to not manipulate this area  any further and at this point I suggested that Dr. Edwyna Shell proceed with  his lung procedure and after that is done I will come back when his  bladder is distended with intraoperative IV fluids,  since at this point  I cannot feel a distended bladder.   I came back after Dr. Edwyna Shell completed his procedure and his bladder did  feel full in the suprapubic area.  The suprapubic area was prepped and  draped in a usual fashion.  A percutaneous suprapubic tube was placed in  the bladder without difficulty after making a stab wound in the  suprapubic region about an inch above the symphysis pubis and directing  the percutaneous catheter down toward the end of the bladder and got  copious flow of clear  urine.  I then advanced the catheter without the needle and retracted  the string and locked it into a full coil position and retraction  revealed continued drainage of clear urine and a good fixation of the  loop.  Black silk suture was used to suture his SP tube in place and the  SP tube was connected to close drainage.      Maretta Bees. Vonita Moss, M.D.  Electronically Signed     LJP/MEDQ  D:  11/10/2007  T:  11/11/2007  Job:  [id number]   cc:   Kari Baars, M.D.  Ines Bloomer, M.D.

## 2010-11-30 NOTE — Op Note (Signed)
Nicholas Murray, GELLES        ACCOUNT NO.:  [id number]   MEDICAL RECORD NO.:  [id number]          PATIENT TYPE:  INP   LOCATION:  2308                         FACILITY:  [facility]   PHYSICIAN:  Ines Bloomer, M.D. DATE OF BIRTH:  07/21/1965   DATE OF PROCEDURE:  11/10/2007  DATE OF DISCHARGE:                               OPERATIVE REPORT   PREOPERATIVE DIAGNOSIS:  Empyema right chest.   POSTOPERATIVE DIAGNOSES:  1. Empyema right chest.  2. Right lower lobe pneumonia.   OPERATION PERFORMED:  1. Right video-assisted thoracoscopic surgery.  2. Right thoracotomy.  3. Drainage of empyema with decortication.   INDICATIONS FOR PROCEDURE:  This 45 year old male was admitted with  acute renal failure and right lower lobe pneumonia.  The renal failure  resolved and he was found to have some loculated effusions.  The  followup CT scan shows that these are improved and his white count came  down and  he was started on antibiotics for presumably a strep  pneumonia.  With improvement in the CT scan, he was sent home on IV  antibiotics and then developed a temperature of 103 and was brought back  in and we repeated a CT scan and showed that although the pneumonia was  about stable, the area of loculations that were improving were now  worse.  So, we decided bring him to the operating room for drainage.  His white count was 14,000.   PROCEDURE IN DETAIL:  After general anesthesia and insertion of a dual-  lumen tube, attempt was made to insert a Foley and the Foley would not  pass.  Blood was seen at the attempt to inserting the Foley, so we  called Dr. Vonita Moss who did a cystoscopy and will dictate this  separately and was not able to pass the Foley either and so he will  dictate insertion of a suprapubic.  The patient was then turned to the  right lateral thoracotomy position, prepped and draped in the usual  sterile manner.  Dual-lumen tube as mentioned had been inserted.  The  right  lung was deflated.  Two trocar sites were made in the anterior  portion of the axillary line in seventh intercostal space.  A 30-degree  scope was inserted and the patient had a marked inflammatory exudate and  empyema.  We were able to open the posterior pocket and sent some of  this for culture, but there was so much inflammation in the lung that we  could not decorticate it without doing an open incision, so a  posterolateral thoracotomy incision was made over the triangle of  auscultation at the sixth intercostal space.  The latissimus was  partially divided.  The serratus was reflected anteriorly.  The sixth  muscle space was entered.  With this, we first started superiorly  debriding up the abscess where the empyema loculations were by stripping  of the inflammatory pleura.  We then went posteriorly and found another  large pocket inferiorly around the diaphragm and freed this up and  debrided this and going anteriorly the third large pocket was found and  this was debrided off the  chest wall.  After all inflammatory tissue had  been removed off the chest wall, we then freed the right lower lobe off  the diaphragm and the right middle lobe off the diaphragm and freed up  the fissure.  We did a decortication of some thickened pleura off the  right middle lobe and off the anterior segment of the right upper lobe  there was an area where the previous abscess had been and we resected  that with EZ45 stapler as well as taking a piece of the right middle  lobe with the EZ45 stapler and then did a decortication of the lower  lobe, freeing up the thickened tumor.  After this had been done, all  bleedings were electrocoagulated.  Three chest tubes were placed, two to  the trocar sites anteriorly and posteriorly and then another one as a  right angle between these two into the costophrenic sulcus.  Marcaine  block was done in the usual fashion.  A single On-Q was inserted in the usual fashion.   The chest was closed  with 5 pericostals, #1 Vicryl in the muscle layer, 2-0 Vicryl in the  subcutaneous tissue, and Ethicon skin clips.  The patient was returned  to the recovery room in stable condition.      Ines Bloomer, M.D.  Electronically Signed     DPB/MEDQ  D:  11/10/2007  T:  11/11/2007  Job:  [id number]

## 2010-11-30 NOTE — Op Note (Signed)
NAME:  Nicholas Murray, Nicholas Murray        ACCOUNT NO.:  [id number]   MEDICAL RECORD NO.:  [id number]          PATIENT TYPE:  INP   LOCATION:  3308                         FACILITY:  [facility]   PHYSICIAN:  Wilmon Arms. Corliss Skains, M.D. DATE OF BIRTH:  Dec 06, 1965   DATE OF PROCEDURE:  11/15/2007  DATE OF DISCHARGE:                               OPERATIVE REPORT   PREOPERATIVE DIAGNOSIS:  Peritonitis.   POSTOPERATIVE DIAGNOSIS:  Peritonitis secondary to extravasated urine.   PROCEDURE PERFORMED:  1. Exploratory laparotomy.  2. Remainder of the procedure involving the placement of suprapubic      tube and urethral dilation will all be dictated by Dr. Larey Dresser who was the co-surgeon.   SURGEON:  Wilmon Arms. Corliss Skains, MD., FACS   ANESTHESIA:  General endotracheal.   INDICATIONS:  The patient is a 45 year old male who is status post a  recent thoracotomy for empyema.  At that time, they were unable to place  a Foley catheter due to urethral false passage.  Therefore, suprapubic  tube was placed percutaneously by Dr. Vonita Moss.  The tube was removed  yesterday.  Since that time, the patient has developed peritoneal signs.  He remains with normal white count.  He has had a bowel movement today.   OPERATIVE FINDINGS:  The patient had a lot of edema and extravasated  urine around his bladder.  He also had a clear urine in the peritoneal  cavity.  The examined portion of the bowel appeared normal.  No  purulence or foul-smelling urine was noted.   DESCRIPTION OF PROCEDURE:  Most of the procedure will be dictated by Dr.  Vonita Moss.  The peritoneum was opened at the upper edge of the incision  above the bladder.  Upon open of the peritoneum, a large amount of clear  fluid was encountered.  There was no sign of purulence or foul smell.  We suctioned out several 100 mL of clear fluid.  We examined some of the  small bowel.  All of the exam of small bowel appeared normal.  We placed  pull suction down  into the pelvis and suctioned out more clear fluid.  We decided not to open the peritoneal cavity any further.  The  peritoneum was closed with 0 Vicryl.  After Dr. Vonita Moss completed the  remainder of the procedure with the placement of suprapubic tube.  I  closed the fascia with #1 PDS in running fashion.  The subcutaneous  tissues were irrigated.  Staples were used to close the skin.  The  patient was extubated and brought to recovery in stable condition.  All  sponge, instrument, and needle counts were correct.      Wilmon Arms. Tsuei, M.D.  Electronically Signed    MKT/MEDQ  D:  11/15/2007  T:  11/16/2007  Job:  [id number]

## 2010-11-30 NOTE — Consult Note (Signed)
NAME:  Nicholas Murray, Nicholas Murray        ACCOUNT NO.:  [id number]   MEDICAL RECORD NO.:  [id number]          PATIENT TYPE:  INP   LOCATION:  3151                         FACILITY:  [facility]   PHYSICIAN:  Sol Blazing, M.D.DATE OF BIRTH:  12/17/1965   DATE OF CONSULTATION:  10/28/2007  DATE OF DISCHARGE:                                 CONSULTATION   REASON FOR CONSULTATION:  Elevated creatinine.   HISTORY:  The patient is a 45 year old previously healthy white male who  has never been sick or hospitalized.  He takes no chronic medications.  He works with an Water quality scientist.  He has two children and does  not smoke.  He is married, and his wife is with him today.   The patient first got ill on March 1 about 5 weeks ago.  He was treated  with Levaquin for sinusitis, got better but required course of  prednisone prescribed by an ENT doctor for persistent symptoms of nasal  and sinus congestion.  Then he took a work-related trip and returned  last weekend feeling sick, with malaise and subsequently subjective  fevers, chills, some myalgias. He then developed a worsening cough over  the next several days with fevers and chills and stayed out of work the  middle of the week.  Over the last 3 days, he has noticed his urine  changed to a color of sweet tea, and he has had some mild dysuria.  He  had a negative flu swab done.  He presented to the hospital with these  symptoms, was found to have a large right bronchopneumonia and a  creatinine of 4.9.  He denies any history of renal failure.  He has been  taking ibuprofen regularly over the last several days.   SOCIAL HISTORY:  As above.   CURRENT MEDICATION LIST:  Nystatin and Tussionex.   ALLERGIES:  To penicillin.   REVIEW OF SYSTEMS:  As above.   PHYSICAL EXAM:  VITAL SIGNS:  Blood pressure on presentation was 126/88,  currently 140/80, respirations 18, temperature 97.8, O2 saturation 95%  on room air.  GENERAL:  This is up  healthy-appearing, well-nourished white male.  He  is coughing and looks somewhat acutely ill but not chronically ill.  HEENT:  PERRL.  EOMI.  Throat is clear.  NECK:  Supple without JVD.  The neck veins are flat actually.  CHEST:  Bronchial breath sounds on the right lung, 1/2 up the posterior  lung fields the left lung is clear.  CARDIAC:  Regular rate and rhythm without murmur or gallop.  ABDOMEN:  Soft, nontender without masses.  The abdomen is soft without  masses.  He does have mild diffuse tenderness but no peritoneal signs.  He does have active bowel sounds.  No femoral bruits.  EXTREMITIES:  No peripheral edema and no petechiae or purpura.  NEUROLOGICAL:  Alert and oriented x3.  No focal deficits.   LABS:  BUN was 43 with a creatinine 4.9.  On admission, is up to 5.6  this afternoon, sodium 132, potassium 4.2, CO2 21, hemoglobin 11.8,  white blood count 17,000, platelets 248, albumin 1.7, calcium  7.1.  Chest x-ray:  Large right right lower lobe pneumonia.  AST and ALT are  normal.  Bilirubin was normal.  Urine:  Sodium 22.  Urinalysis done by  the undersigned, positive for protein, 2+ small amount of blood, and the  sediment shows diffuse debris with numerous white blood cells, numerous  tubular epithelial cells, numerous granular casts, occasional  degenerated cellular casts.  I do not see any discrete red cell casts,  but he does have 5-10 red blood cells per high-power field.  Serum CPK  is normal.   IMPRESSION:  1. Acute renal failure in a patient with a 5-week history of initially      sinusitis which cleared and now bronchopneumonia.  He has a urinary      sediment and a clinical picture suspicious enough for rapidly      progressive glomerulonephritis that I would recommend treating him      with bolus IV steroids while awaiting further testing.  Most      likely, diagnosis of glomerulonephritis in this setting would be an      ANCA-related of pulmonary renal syndrome  such as Wegener's.  He      also could have ATN based on his NSAID use and some volume      depletion.  However, he was not hypotensive on admission, and the      sediment is a little bit too active for in my opinion for that      diagnosis at this point.  2. Pneumonia.  3. Recent sinus infection.   RECOMMENDATIONS:  1. Check multiple serologies including ANA, ANCA, anti-GBM antibody,      HIV, C3, C4, hepatitis B, hepatitis C and serum cryoglobulins.  2. IV Solu-Medrol 500 mg daily for 3 days.  3. We will consider possibility of renal biopsy.  Will discuss with my      colleagues.  4. Continue IV fluids.  No signs of volume overload yet.  Watch urine      output closely.      Sol Blazing, M.D.  Electronically Signed     RDS/MEDQ  D:  10/28/2007  T:  10/28/2007  Job:  [id number]   cc:   Janalyn Rouse, M.D.  Fax: [phone]

## 2010-11-30 NOTE — Letter (Signed)
March 25, 2008   Bimal R. Sherryll Burger, MD  [address]  [place], [place] [address]   Re:  Nicholas Murray, Nicholas Murray                DOB:  [birth year]   Dear Dr. Sherryll Burger:   I saw the patient today and his chest x-ray showed that he is doing  well.  He says most inflammatory reaction are all resolved from his  previous empyemas.  I have released him to full activities and we will  have to see him again if he has any future problems.  His blood pressure  was 123/87, pulse 72, respirations 18, and sats were 98%.  Lungs were  clear to auscultation and percussion.   Ines Bloomer, M.D.  Electronically Signed   DPB/MEDQ  D:  03/25/2008  T:  03/26/2008  Job:  [id number]

## 2010-11-30 NOTE — Letter (Signed)
Nov 28, 2007   Kari Baars, M.D.  [address]  [place], [place] [address]   Re:  Nicholas Murray, Nicholas Murray                DOB:  Jun 10, 1966   Dear Gala Romney:   I saw Nicholas Murray back today.  He is doing remarkably well.  His chest  x-ray continues to improve.  His incisions are well healed.  I removed  his chest tube sutures.  His blood pressure is 150/93, pulse 100,  respirations 18, saturations were 97%.   I told him to gradually increase his activities.  He could start  driving; not to lift anything over 20 pounds.   I will see him back again in 3 weeks with a chest x-ray.   Ines Bloomer, M.D.  Electronically Signed   DPB/MEDQ  D:  11/28/2007  T:  11/28/2007  Job:  [id number]

## 2010-11-30 NOTE — Assessment & Plan Note (Signed)
OFFICE VISIT   Nicholas Murray, Nicholas Murray  DOB:  [birth year]                                        December 20, 2007  CHART #:  [id number]   HISTORY:  Nicholas Murray is a 45 year old male who in April of this year  underwent a right video-assisted thoracoscopic surgery with right  thoracotomy, drainage of empyema with decortication.  This was done on  November 10, 2007, by Dr. Edwyna Shell.  During the hospitalization, he had  difficulty involving a urethral stricture, which caused extraperitoneal  extravasation of urine, requiring surgical intervention by Drs. Vonita Moss  and Fannie Knee.  Currently he reports that he is making very steady progress.  He has some moderate discomfort in the right anterior chest.  He denies  significant shortness of breath.  He denies fevers, chills or other  constitutional symptoms.  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   Chest x-ray:  The chest x-ray done on today's date was reviewed.  There  was some moderate right lower lung field opacification that is very  stable or slightly improved compared to previous film.  There are no new  acute findings.   PHYSICAL EXAMINATION:  Vital signs:  Blood pressure is 145/95, pulse 80,  respiration 18, oxygen saturation is 99% on room air.  General  appearance:  Well-developed adult male in no acute distress.  Pulmonary  examination revealed slightly diminished breath sounds in the right  base, otherwise clear.  Cardiac:  Regular rate and rhythm.  Normal S1  and S2.  Abdomen:  Soft, nontender.  Incisions are all healed well without  evidence of infection.   ASSESSMENT:  Nicholas Murray continues to make adequate improvement  following his hospitalization for right empyema.  He reports that he is  having no difficulty in voiding.  He has followed up with a urologist on  a couple of occasions and is doing well in that regard.  From our  regard, Dr. Edwyna Shell wants to see him in one  month.  We will obtain a  repeat chest x-ray at that  time.  Additionally he was given a prescription for more Vicodin for  control of his chest wall pain.   Rowe Clack, P.A.-C.   Sherryll Burger  D:  12/20/2007  T:  12/20/2007  Job:  [id number]   cc:   Ines Bloomer, M.D.  Kari Baars, M.D.

## 2010-11-30 NOTE — Consult Note (Signed)
NAME:  Nicholas Murray, Nicholas Murray        ACCOUNT NO.:  [id number]   MEDICAL RECORD NO.:  [id number]          PATIENT TYPE:  INP   LOCATION:  3308                         FACILITY:  [facility]   PHYSICIAN:  Wilmon Arms. Corliss Skains, M.D. DATE OF BIRTH:  07/26/1965   DATE OF CONSULTATION:  11/15/2007  DATE OF DISCHARGE:                                 CONSULTATION   TIME OF CONSULTATION:  12:00 p.m.   UROLOGIST:  Maretta Bees. Vonita Moss, M.D.   CARDIOVASCULAR/THORACIC SURGEON:  Ines Bloomer, M.D.   REASON FOR CONSULTATION:  Abdominal pain/peritonitis.   HISTORY OF PRESENT ILLNESS:  This is a 45 year old white male who was  otherwise healthy prior to his admission to the hospital.  He was  admitted to the hospital on October 27, 2007, with community-acquired  pneumonia and acute renal failure.  The patient was treated with  antibiotics and was beginning to improve and later discharged.  His  first day at home, the home health nurse noticed that the patient was  running a temperature of 103 and was therefore readmitted to the  hospital.  On admission, his chest x-ray showed a full whiteout of right  pneumonia.  At this time, Dr. Edwyna Shell was consulted, and he took him to  the OR for a right thoracotomy and drainage of empyema with  decortication.  While trying to get the patient Foley catheter placed  during this procedure, they were unable to do so; and at that time, Dr.  Vonita Moss with Urology was consulted to do a suprapubic catheter.  After  surgery, the patient did well with both the surgery as well as the  suprapubic catheter.  He was beginning to improve; and therefore, the  patient suprapubic catheter was removed yesterday.  The patient says  after it was removed, he was able to void on his own quite well.  However, last night, he began having a sense of fullness and urgency;  however, he was unable to void.  Later in the night, he began having  abdominal pain that has since progressively worsened.   This morning, the  patient states that he had a bowel movement and some flatus with  tremendous abdominal pain associated with these.  His white count today  is normal at 6.2.  He has had a portable chest x-ray as well as a one-  view abdominal x-ray today, in which none of them showed any free air;  however, the abdominal view showed a mild ileus.  Currently, the patient  has severe pain; and therefore, we were consulted for possible acute  abdomen for surgical intervention.   REVIEW OF SYSTEMS:  See HPI.  Otherwise, all other systems are negative.  The patient currently denies any chest pain or shortness of breath,  weakness, or dizziness.   FAMILY HISTORY:  Noncontributory.   PAST MEDICAL HISTORY:  Consistent with:  1. Hyperlipidemia.  2. Dysplastic moles.   PAST SURGICAL HISTORY:  1. He has had two left wrist fractures; and I am unaware if he has had      these surgically repaired.  2. He has also had a right thoracotomy per Dr.  Edwyna Shell on November 10, 2007, as well as.  3. Cystoscopy and percutaneous suprapubic tube insertion by Dr.      Vonita Moss on November 10, 2007, as well.   SOCIAL HISTORY:  The patient is married and works as a Public affairs consultant jobs at [employer].  He has no tobacco or alcohol abuse.  He  has one son age 48 year old and two 60-year-old twins.   ALLERGIES:  1. PENICILLIN, which caused a rash as a child.  2. VANCOMYCIN, which I think also causes a rash.   MEDICATIONS ON ADMISSION:  1. Vancomycin 1250 mg IV daily.  2. Avelox 400 mg p.o. daily.  3. Tessalon Perles 100 mg p.o. q.8 h. p.r.n.  4. Vicodin 5/500.  5. Mucinex DM.  6. Hydrocodone syrup p.r.n. cough.  7. Albuterol 2 puffs every 6 hours.  8. Colace 100 mg.  9. MiraLax 17 g daily p.r.n. constipation.  10.Since being put in the hospital, the patient has been taken off      vancomycin and started on Zithromax.  11.He also is currently being started on Flagyl 500 mg.   PHYSICAL EXAM:   GENERAL:  This is a pleasant 45 year old white male who  appears in moderate distress while lying in his bed.  VITAL SIGNS:  Temperature max is 99.2, pulse is 100, respirations 18,  and blood pressure 134/75.  HEENT:  Eyes, sclerae noninjected.  Pupils equal, round, and reactive to  light.  Ears, nose, mouth, and throat; ears and nose have no obvious  masses or lesions.  No rhinorrhea.  Mouth is pink and moist.  Throat  shows no exudate.  NECK:  Supple.  No thyromegaly.  Trachea is midline.  LUNGS:  Show decreased breath sounds on the right side.  Otherwise with  anterior auscultation, he is clear to auscultation bilaterally with  nonlabored respiratory effort.  HEART:  Regular rhythm, mildly tachycardiac.  Normal S1 and S2.  No  murmurs, gallops, or rubs are noted.  +2 carotid and pedal pulses  bilaterally.  Chest shows various chest tubes on the right side.  ABDOMEN:  Soft but diffusely exquisitely tender.  The patient is  exhibiting severe signs of peritonitis.  However, he currently has  present bowel sounds.  He is somewhat distended.  He also, of note, has  a heeling incision in his suprapubic region from his prior suprapubic  catheter.  MUSCULOSKELETAL:  All 4 extremities are symmetrical; and no cyanosis,  clubbing, or edema.  NEUROLOGIC:  Cranial nerves II through XII appear to be grossly intact.  Deep tendon reflexes deferred at this time.  PSYCH:  The patient is alert and oriented x3 with an appropriate affect.   LABS AND DIAGNOSTICS:  Blood cultures are negative.  White blood cell  count is 6.2, which has come up from 3.9; hemoglobin is 9.3, hematocrit  is 27.8, and platelets are 301,000.  Sodium 136, potassium 3.8, BUN 14,  creatinine 2.10, and glucose 116.  LFTs are all normal.   Diagnostics, a one-view portable abdominal x-ray shows a mild ileus, but  no free air seen.  A portable chest x-ray done this morning shows right  atelectasis.  No free air seen on this x-ray as  well.   IMPRESSION:  1. Acute abdomen.  2. Community-acquired pneumonia, for which he is status post right      thoracotomy as well as drainage of an empyema.  3. Acute renal insufficiency.   PLAN:  At  this time, we are going to order a stat one-view abdominal  portable x-ray.  We are also at this time planning for an  urgent/emergent exploratory laparotomy to be done for a possible bowel  injury or leak from the patient's bladder due to a prior suprapubic  catheter placement or any other reason that might be causing the  patient's severe peritonitis.  At this time, Dr. Corliss Skains has spoken with  Dr. Vonita Moss who will come in and assist him in the OR for this case.  The procedure risks and benefits have been explained to the patient; and  at this time, he understands and wishes to proceed.  At this time, we  will keep him NPO.  Consent will be obtained, and Flagyl 500 mg IV q.8  h. will prophylactically be started.      Letha Cape, PA      Wilmon Arms. Tsuei, M.D.  Electronically Signed    KEO/MEDQ  D:  11/15/2007  T:  11/16/2007  Job:  [id number]   cc:   Kari Baars, M.D.  Maretta Bees. Vonita Moss, M.D.  Ines Bloomer, M.D.

## 2010-12-03 NOTE — Discharge Summary (Signed)
NAME:  KOEHN, SALEHI        ACCOUNT NO.:  [id number]   MEDICAL RECORD NO.:  [id number]          PATIENT TYPE:  INP   LOCATION:  9983                         FACILITY:  [facility]   PHYSICIAN:  Janalyn Rouse, M.D.  DATE OF BIRTH:  1965/11/03   DATE OF ADMISSION:  10/27/2007  DATE OF DISCHARGE:  11/07/2007                               DISCHARGE SUMMARY   DISCHARGE DIAGNOSES:  1. Severe right lower lobe pneumonia.  2. Right lower lobe empyema.  3. Sepsis syndrome secondary to severe right lower lobe pneumonia.  4. Acute renal failure secondary to acute tubular necrosis.  5. Anemia.  6. Low back pain, musculoskeletal.  7. Hyperlipidemia.  8. History of dysplastic moles.   DISCHARGE MEDICATIONS:  1. Vancomycin 1250 mg IV daily x11 days by [facility].  2. Avelox 400 mg daily x12 days.  3. Tessalon Perles 100 mg q.8 h. p.r.n. cough.  4. Vicodin 5/500 q.6 h. p.r.n. moderate-to-severe pain, #60, with 0      refills.  5. Mucinex DM 1 b.i.d. p.r.n. cough.  6. Vicodin syrup 5 mL nightly p.r.n. cough.  7. Albuterol HFA 2 puffs q.6 h. p.r.n. wheezing and shortness of      breath.  8. Colace 100 mg 1-2 times daily p.r.n. stool softener.  9. MiraLax 17 grams daily as needed for laxative.   HOSPITAL PROCEDURES:  1. Renal ultrasound (October 27, 2007) - negative for hydronephrosis.      Echogenic kidneys suggest chronic renal medical disease.  Small      right pleural effusion.  2. CT of the chest without contrast (October 28, 2007) - extensive      pneumonia with large area of consolidation involving the right      lower lobe and extending into the right upper lobe.  Small area of      pneumonia identified in the medial left lower lobe.  Associated      small amount of dependent pleural fluid on the right and 2 discrete      loculated pleural fluid collections in the right hemithorax.  These      2 contain relatively low-density fluid.  It cannot be excluded.      These represent  evolving/developing empyemas.  3. Ultrasound-guided renal biopsy.  Left renal biopsy (October 30, 2007)      - pathology consistent with acute tubular necrosis.  4. CT of the chest without contrast (November 02, 2007) - interval      improvement in right lower lobe pneumonia.  Interval improvement in      loculated pleural effusions on the right.  Increase in small left      effusion.  5. Bilateral decubitus chest x-ray (November 04, 2007) - left effusion      layers freely.  Small layering of the right effusion with some      loculated components.   HOSPITAL CONSULTS:  1. Infectious Disease (Dr. Lars Mage and Dr. Michel Bickers).  2. Nephrology (Dr. Jonnie Finner).  3. Pulmonary Critical Care Medicine (Dr. Joya Gaskins).  4. Cardiothoracic Surgery (Dr. Arlyce Dice).  5. Interventional Radiology.   HISTORY OF PRESENT ILLNESS:  For  full details, please see dictated  history and physical by Dr. Forde Dandy.  Mr. Congrove is a healthy 40-year-  old white male with no significant past medical history who presents to  the emergency department on October 27, 2007, with complaint of fever and  generalized weakness.  The patient was seen in our office little over a  month prior to admission on September 16, 2007, for sinusitis.  He was  treated with Levaquin and referred to ENT.  At that time, he did see ENT  in [facility] and was placed on prednisone with interval improvement in  his symptoms.  He has traveled extensively since that time, and states  that while in [place], he developed a flu-like syndrome with malaise and  weakness.  Upon return, he had generalized myalgias, fever, chills, and  cough.  He was seen at Urgent Care several days prior to admission and  diagnosed with thrush and felt due to his prednisone therapy.  He was  treated with nystatin and Tussionex for his cough, which I thought was  viral.  Apparently, no chest x-ray was performed at that time.  On the  day of admission, he developed significant worsening  of his malaise and  weakness, and presented to the emergency department.  In the emergency  department, his temperature was 97.8, blood pressure 126/88, pulse 100,  respirations of 95% on room air.  Labs showed an elevated white count of  17.9 and BUN and creatinine of 43 and 4.9.  Chest x-ray showed an  extensive right middle lobe opacity with possible loculated effusion  consistent with pneumonia.  Given his acute pneumonia and renal failure,  he was admitted for further management.   HOSPITAL COURSE:  The patient was admitted to a step-down bed.  He was  placed on azithromycin, vancomycin, and Cipro empirically for broad-  spectrum coverage of community-acquired pneumonia in the setting of his  penicillin allergy.  Infectious Disease was consulted on admission.  Despite hydration, his renal function continued to worsen initially with  an increase in creatinine to 5.9.  Extensive laboratory evaluation was  undertaken for his acute renal failure per Nephrology guidance and did  show a low complement levels.  HIV, anti-GBM, ANCA, myoglobin, and CK  levels were all unremarkable ultimately.  Due to persistent renal  dysfunction in the setting of his pulmonary disease, a renal biopsy was  performed.  Results ultimately were consistent with ATN and felt  secondary to his sepsis syndrome from his severe pneumonia.   The patient did have a severe right lower lobe pneumonia with loculated  effusion.  On October 30, 2007, his Cipro was changed to Avelox for a  better respiratory coverage.  He was continued on vancomycin as well.  Pulmonology and Cardiothoracic Surgery was consulted.  Serial CT scans  did show interval improvement in the right lower lobe opacity and  loculated effusion.  Given this improvement, Dr. Arlyce Dice felt that VATS  or direct decortication was necessary at this point.  The patient's  condition continued to slowly improve and was stable on the day of  discharge with plans for  close outpatient followup.   On the day of discharge, his right lower lobe consolidation and effusion  was slightly improved per Dr. Arlyce Dice.  Renal function had improved to a  BUN and creatinine of 17 and 1.7.  He continued to have an elevated  white count of 15.7, which was also slightly improved from the days  prior.  At the time of discharge, he was felt stable for discharge home,  closed outpatient followup with Dr. Arlyce Dice and myself to monitor for  resolution of his pneumonia and effusions.   DISCHARGE LABS:  CBC shows a white count of 15.7, hemoglobin 10, and  platelets 435.  BMET significant for sodium 135, potassium 4.0, chloride  97, bicarb 27, BUN 17, creatinine 1.7, and glucose 112.  ANCA negative,  anti-GBM antibodies negative, ANA negative.  Sed rate initially 122, but  decreased to 106.  C3 is 115 and C4 is 6 which is low.  HIV negative.  Blood cultures and urine cultures are negative.  Urinary pneumococcal  antigen positive.   DISPOSITION:  Home.   HOSPITAL FOLLOWUP:  He will follow up with Dr. Brigitte Pulse on November 09, 2007,  for repeat labs and a chest x-ray.  He will follow up with Dr. Arlyce Dice as  instructed.  [facility] will provide IV antibiotics.   DISCHARGE INSTRUCTIONS:  He was instructed to call if his temperature  was above 101.5, increasing pleuritic or other chest pain, or shortness  of breath.  He was instructed to avoid Advil, Aleve, and ibuprofen.   DISPOSITION:  To home.      Janalyn Rouse, M.D.  Electronically Signed     WS/MEDQ  D:  02/06/2008  T:  02/06/2008  Job:  [id number]

## 2011-04-12 LAB — BASIC METABOLIC PANEL
BUN: 43 — ABNORMAL HIGH
BUN: 82 — ABNORMAL HIGH
BUN: 99 — ABNORMAL HIGH
BUN: 10
BUN: 104 — ABNORMAL HIGH
BUN: 13
BUN: 18
BUN: 36 — ABNORMAL HIGH
BUN: 62 — ABNORMAL HIGH
CO2: 16 — ABNORMAL LOW
CO2: 18 — ABNORMAL LOW
CO2: 21
CO2: 22
CO2: 21
CO2: 24
CO2: 26
CO2: 27
CO2: 29
CO2: 32
Calcium: 7 — ABNORMAL LOW
Calcium: 7 — ABNORMAL LOW
Calcium: 7.1 — ABNORMAL LOW
Calcium: 7.5 — ABNORMAL LOW
Calcium: 7.5 — ABNORMAL LOW
Calcium: 7.8 — ABNORMAL LOW
Calcium: 8.2 — ABNORMAL LOW
Calcium: 8.3 — ABNORMAL LOW
Calcium: 8.3 — ABNORMAL LOW
Chloride: 101
Chloride: 104
Chloride: 89 — ABNORMAL LOW
Chloride: 98
Chloride: 100
Chloride: 101
Chloride: 105
Chloride: 109
Creatinine, Ser: 4.97 — ABNORMAL HIGH
Creatinine, Ser: 5.52 — ABNORMAL HIGH
Creatinine, Ser: 5.61 — ABNORMAL HIGH
Creatinine, Ser: 5.89 — ABNORMAL HIGH
Creatinine, Ser: 1.47
Creatinine, Ser: 1.54 — ABNORMAL HIGH
Creatinine, Ser: 1.65 — ABNORMAL HIGH
Creatinine, Ser: 1.69 — ABNORMAL HIGH
Creatinine, Ser: 1.71 — ABNORMAL HIGH
Creatinine, Ser: 1.87 — ABNORMAL HIGH
Creatinine, Ser: 3.62 — ABNORMAL HIGH
GFR calc Af Amer: 13 — ABNORMAL LOW
GFR calc Af Amer: 14 — ABNORMAL LOW
GFR calc Af Amer: 14 — ABNORMAL LOW
GFR calc Af Amer: 23 — ABNORMAL LOW
GFR calc Af Amer: 55 — ABNORMAL LOW
GFR calc Af Amer: 56 — ABNORMAL LOW
GFR calc Af Amer: >60
GFR calc Af Amer: >60
GFR calc Af Amer: >60
GFR calc Af Amer: >60
GFR calc non Af Amer: 11 — ABNORMAL LOW
GFR calc non Af Amer: 19 — ABNORMAL LOW
GFR calc non Af Amer: 40 — ABNORMAL LOW
GFR calc non Af Amer: 47 — ABNORMAL LOW
GFR calc non Af Amer: 51 — ABNORMAL LOW
GFR calc non Af Amer: 53 — ABNORMAL LOW
Glucose, Bld: 150 — ABNORMAL HIGH
Glucose, Bld: 169 — ABNORMAL HIGH
Glucose, Bld: 127 — ABNORMAL HIGH
Glucose, Bld: 148 — ABNORMAL HIGH
Glucose, Bld: 175 — ABNORMAL HIGH
Glucose, Bld: 87
Potassium: 4.2
Potassium: 5
Potassium: 3.7
Potassium: 4.5
Potassium: 4.6
Sodium: 132 — ABNORMAL LOW
Sodium: 134 — ABNORMAL LOW
Sodium: 135
Sodium: 135
Sodium: 135
Sodium: 137
Sodium: 139
Sodium: 140
Sodium: 140

## 2011-04-12 LAB — CBC
HCT: 34.6 — ABNORMAL LOW
HCT: 38 — ABNORMAL LOW
HCT: 25.3 — ABNORMAL LOW
HCT: 26.2 — ABNORMAL LOW
HCT: 26.9 — ABNORMAL LOW
HCT: 27.4 — ABNORMAL LOW
HCT: 29.4 — ABNORMAL LOW
HCT: 34.2 — ABNORMAL LOW
Hemoglobin: 11.8 — ABNORMAL LOW
Hemoglobin: 10 — ABNORMAL LOW
Hemoglobin: 10.3 — ABNORMAL LOW
Hemoglobin: 11.1 — ABNORMAL LOW
Hemoglobin: 8.2 — ABNORMAL LOW
Hemoglobin: 8.4 — ABNORMAL LOW
Hemoglobin: 8.4 — ABNORMAL LOW
Hemoglobin: 8.8 — ABNORMAL LOW
Hemoglobin: 9.4 — ABNORMAL LOW
Hemoglobin: 9.6 — ABNORMAL LOW
MCHC: 33
MCHC: 33.8
MCHC: 33.4
MCHC: 33.5
MCHC: 33.5
MCHC: 33.6
MCHC: 34
MCHC: 34
MCHC: 34.4
MCHC: 34.8
MCV: 87.9
MCV: 88.4
MCV: 86.8
MCV: 86.8
MCV: 87
MCV: 87.4
MCV: 87.7
MCV: 88
MCV: 88.7
MCV: 89.6
MCV: 89.7
Platelets: 250
Platelets: 276
Platelets: 205
Platelets: 212
Platelets: 294
Platelets: 301
Platelets: 310
Platelets: 317
Platelets: 329
Platelets: 329
Platelets: 405 — ABNORMAL HIGH
RBC: 3.93 — ABNORMAL LOW
RBC: 2.78 — ABNORMAL LOW
RBC: 2.82 — ABNORMAL LOW
RBC: 2.83 — ABNORMAL LOW
RBC: 2.92 — ABNORMAL LOW
RBC: 3.1 — ABNORMAL LOW
RBC: 3.16 — ABNORMAL LOW
RBC: 3.32 — ABNORMAL LOW
RBC: 3.59 — ABNORMAL LOW
RBC: 3.84 — ABNORMAL LOW
RDW: 13.8
RDW: 14.1
RDW: 13
RDW: 13.3
RDW: 13.3
RDW: 13.3
RDW: 13.7
RDW: 13.7
RDW: 13.7
RDW: 13.8
RDW: 14
RDW: 14.5
WBC: 16.9 — ABNORMAL HIGH
WBC: 14.1 — ABNORMAL HIGH
WBC: 14.4 — ABNORMAL HIGH
WBC: 15.7 — ABNORMAL HIGH
WBC: 18.5 — ABNORMAL HIGH
WBC: 20.5 — ABNORMAL HIGH
WBC: 6.4

## 2011-04-12 LAB — URINALYSIS, ROUTINE W REFLEX MICROSCOPIC
Glucose, UA: 100 — AB
Glucose, UA: NEGATIVE
Ketones, ur: NEGATIVE
Leukocytes, UA: NEGATIVE
Nitrite: NEGATIVE
Protein, ur: 100 — AB
Protein, ur: NEGATIVE
Specific Gravity, Urine: 1.017
Specific Gravity, Urine: 1.011
Urobilinogen, UA: 2 — ABNORMAL HIGH
pH: 5
pH: 5.5

## 2011-04-12 LAB — COMPREHENSIVE METABOLIC PANEL
ALT: 35
ALT: 24
ALT: 27
ALT: 29
AST: 37
AST: 12
AST: 21
AST: 29
AST: 30
Albumin: 1.6 — ABNORMAL LOW
Albumin: 1.3 — ABNORMAL LOW
Albumin: 1.4 — ABNORMAL LOW
Albumin: 1.7 — ABNORMAL LOW
Albumin: 1.7 — ABNORMAL LOW
Albumin: 1.9 — ABNORMAL LOW
Alkaline Phosphatase: 113
Alkaline Phosphatase: 47
Alkaline Phosphatase: 60
Alkaline Phosphatase: 61
Alkaline Phosphatase: 98
BUN: 70 — ABNORMAL HIGH
BUN: 17
CO2: 14 — ABNORMAL LOW
CO2: 24
CO2: 26
CO2: 27
CO2: 28
Calcium: 6.9 — ABNORMAL LOW
Calcium: 7.1 — ABNORMAL LOW
Calcium: 7.8 — ABNORMAL LOW
Chloride: 102
Chloride: 104
Chloride: 98
Chloride: 99
Creatinine, Ser: 5.85 — ABNORMAL HIGH
Creatinine, Ser: 1.68 — ABNORMAL HIGH
Creatinine, Ser: 2.1 — ABNORMAL HIGH
GFR calc Af Amer: 14 — ABNORMAL LOW
GFR calc Af Amer: 43 — ABNORMAL LOW
GFR calc Af Amer: 58 — ABNORMAL LOW
GFR calc Af Amer: 59 — ABNORMAL LOW
GFR calc non Af Amer: 11 — ABNORMAL LOW
GFR calc non Af Amer: 35 — ABNORMAL LOW
GFR calc non Af Amer: 46 — ABNORMAL LOW
GFR calc non Af Amer: 49 — ABNORMAL LOW
Glucose, Bld: 119 — ABNORMAL HIGH
Glucose, Bld: 126 — ABNORMAL HIGH
Potassium: 3.8
Potassium: 3.5
Potassium: 4
Potassium: 4
Potassium: 4.4
Sodium: 132 — ABNORMAL LOW
Sodium: 139
Sodium: 141
Total Bilirubin: 1.1
Total Bilirubin: 0.4
Total Bilirubin: 0.5
Total Bilirubin: 0.6
Total Protein: 5.5 — ABNORMAL LOW
Total Protein: 4.3 — ABNORMAL LOW
Total Protein: 4.7 — ABNORMAL LOW

## 2011-04-12 LAB — CULTURE, BLOOD (ROUTINE X 2)
Culture: NO GROWTH
Culture: NO GROWTH
Culture: NO GROWTH
Culture: NO GROWTH

## 2011-04-12 LAB — IRON AND TIBC
Iron: 64
TIBC: 186 — ABNORMAL LOW
UIBC: 122
UIBC: 97

## 2011-04-12 LAB — DIFFERENTIAL
Basophils Absolute: 0
Basophils Absolute: 0
Basophils Relative: 0
Basophils Relative: 1
Eosinophils Relative: 5
Lymphocytes Relative: 5 — ABNORMAL LOW
Monocytes Absolute: 0.3
Monocytes Absolute: 0.7
Monocytes Relative: 8
Neutro Abs: 3.1
Neutro Abs: 7.6
Neutrophils Relative %: 86 — ABNORMAL HIGH

## 2011-04-12 LAB — URINE MICROSCOPIC-ADD ON

## 2011-04-12 LAB — TISSUE CULTURE

## 2011-04-12 LAB — C4 COMPLEMENT: Complement C4, Body Fluid: 6 — ABNORMAL LOW

## 2011-04-12 LAB — PROTIME-INR
INR: 1.3
Prothrombin Time: 16.9 — ABNORMAL HIGH

## 2011-04-12 LAB — FERRITIN
Ferritin: 451 — ABNORMAL HIGH (ref 22–322)
Ferritin: 979 — ABNORMAL HIGH (ref 22–322)

## 2011-04-12 LAB — MISCELLANEOUS TEST

## 2011-04-12 LAB — POCT I-STAT 4, (NA,K, GLUC, HGB,HCT)
Glucose, Bld: 142 — ABNORMAL HIGH
Hemoglobin: 8.2 — ABNORMAL LOW
Potassium: 4.2
Sodium: 136
Sodium: 136

## 2011-04-12 LAB — PHOSPHORUS
Phosphorus: 4.5
Phosphorus: 3.7
Phosphorus: 3.9

## 2011-04-12 LAB — SODIUM, URINE, RANDOM: Sodium, Ur: 22

## 2011-04-12 LAB — BLEEDING TIME
Bleeding Time: 5
Bleeding Time: 9.5

## 2011-04-12 LAB — ANTI-NEUTROPHIL ANTIBODY: Cytoplasmic Neutrophilic Ab: <1:20 {titer}

## 2011-04-12 LAB — POCT I-STAT 3, ART BLOOD GAS (G3+)
Operator id: 287011
Patient temperature: 100.6
pH, Arterial: 7.417

## 2011-04-12 LAB — HEPATITIS PANEL, ACUTE
HCV Ab: NEGATIVE
Hepatitis B Surface Ag: NEGATIVE

## 2011-04-12 LAB — STREP PNEUMONIAE URINARY ANTIGEN: Strep Pneumo Urinary Antigen: POSITIVE — AB

## 2011-04-12 LAB — URINE CULTURE
Colony Count: NO GROWTH
Colony Count: NO GROWTH
Culture: NO GROWTH
Culture: NO GROWTH
Culture: NO GROWTH
Culture: NO GROWTH
Special Requests: NEGATIVE

## 2011-04-12 LAB — TYPE AND SCREEN
ABO/RH(D): A POS
Antibody Screen: NEGATIVE

## 2011-04-12 LAB — ABO/RH: ABO/RH(D): A POS

## 2011-04-12 LAB — CARDIAC PANEL(CRET KIN+CKTOT+MB+TROPI)
CK, MB: 2.2
Relative Index: INVALID
Total CK: 25

## 2011-04-12 LAB — APTT: aPTT: 32

## 2011-04-12 LAB — RENAL FUNCTION PANEL
Albumin: 1.8 — ABNORMAL LOW
Chloride: 105
Creatinine, Ser: 1.76 — ABNORMAL HIGH
GFR calc Af Amer: 52 — ABNORMAL LOW
GFR calc non Af Amer: 43 — ABNORMAL LOW
Phosphorus: 4.1

## 2011-04-12 LAB — NA AND K (SODIUM & POTASSIUM), RAND UR: Potassium Urine: 48

## 2011-04-12 LAB — CREATININE, URINE, RANDOM: Creatinine, Urine: 157.6

## 2011-04-12 LAB — LEGIONELLA ANTIGEN, URINE: Legionella Antigen, Urine: NEGATIVE

## 2011-04-12 LAB — AFB CULTURE WITH SMEAR (NOT AT ARMC): Acid Fast Smear: NONE SEEN

## 2011-04-12 LAB — FUNGUS CULTURE W SMEAR

## 2011-04-12 LAB — ANA: Anti Nuclear Antibody(ANA): NEGATIVE

## 2011-04-12 LAB — CRYOGLOBULIN

## 2011-04-12 LAB — GLOMERULAR BASEMENT MEMBRANE ANTIBODIES: GBM Ab: 0 AU/mL (ref 0–19)

## 2011-04-12 LAB — CK: Total CK: 22

## 2011-04-12 LAB — MYOGLOBIN, URINE: Myoglobin, Ur: <27 mcg/L (ref <28)

## 2011-04-12 LAB — HEMOGLOBIN A1C: Hgb A1c MFr Bld: 5.7

## 2011-04-12 LAB — FOLATE: Folate: 6.1

## 2011-04-12 LAB — VANCOMYCIN, RANDOM: Vancomycin Rm: 10.5

## 2011-04-12 LAB — SEDIMENTATION RATE: Sed Rate: 122 — ABNORMAL HIGH

## 2011-05-04 ENCOUNTER — Other Ambulatory Visit: Payer: Self-pay | Admitting: Dermatology

## 2013-07-04 ENCOUNTER — Other Ambulatory Visit: Payer: Self-pay | Admitting: Dermatology

## 2014-07-25 ENCOUNTER — Other Ambulatory Visit: Payer: Self-pay | Admitting: Dermatology

## 2015-02-23 ENCOUNTER — Other Ambulatory Visit: Payer: Self-pay | Admitting: Physician Assistant

## 2015-02-24 ENCOUNTER — Other Ambulatory Visit: Payer: Self-pay | Admitting: Physician Assistant

## 2015-02-24 NOTE — H&P (Signed)
Nicholas Murray is a new patient to the office.  Healthy 50 year-old active male.  He comes in for evaluation and treatment recommendation for a new acute injury, right knee.  Going down a child's slide and landed awkwardly with a vertical load twist hyperextension injury to his right knee.  Initial pain and swelling which improved.  He felt like this was almost getting normal, but then he turned and he has had marked persistent symptoms.  All of this is medial side.  He does not like full extension.  Really not describing instability.  No significant previous issues with his knee.  Comes in for evaluation and treatment recommendation.   History reviewed.  He works in an Insurance underwriter job, mostly Chief Operating Officer.  He is very active and wants to continue to be active.    EXAMINATION: General exam is outlined and included in the chart.  Specifically, healthy 49 year-old male.  Height: 5?8.  Weight: 170 pounds.  Antalgic gait on the right.  Right knee point tender medial joint line.  Positive medial McMurray's.  About a 5 degree flexion contracture secondary to pain.  I am not getting any significant instability to testing these collaterals and cruciate ligaments.  I feel like his ACL does come to a good end point.  Good patella tracking.  Trace effusion.  Neurovascularly intact distally.  The opposite left knee has full motion.  Good stability.  No joint line tenderness.    X-RAYS: Three view standing x-ray of the right knee shows no degenerative change.  No acute changes.    DISPOSITION:  I think he probably has a displaced meniscus tear based on what I am seeing.  Given the mechanical symptoms, the flexion contracture and the fact that he is getting worse rather than better, I want to find out what is going on to pursue treatment.  We have discussed MRI scan.  He is going to talk to me when that is complete.  If in fact he has a meniscus tear we have discussed definitive treatment in some detail.  Procedure, risks,  benefits, complications and anticipated rehab and recovery all outlined.  Final decision about proceeding after we his scan.  He understands.  Cautious activity in the interim.  I will talk to him as soon as the MRI is complete.    Nicholas Murray, M.D.

## 2015-03-16 ENCOUNTER — Encounter (HOSPITAL_BASED_OUTPATIENT_CLINIC_OR_DEPARTMENT_OTHER): Payer: Self-pay | Admitting: *Deleted

## 2015-03-16 NOTE — H&P (Signed)
  Nicholas Murray is a new patient to the office.  Healthy 49 year-old active male.  He comes in for evaluation and treatment recommendation for a new acute injury, right knee.  Going down a child's slide and landed awkwardly with a vertical load twist hyperextension injury to his right knee.  Initial pain and swelling which improved.  He felt like this was almost getting normal, but then he turned and he has had marked persistent symptoms.  All of this is medial side.  He does not like full extension.  Really not describing instability.  No significant previous issues with his knee.  Comes in for evaluation and treatment recommendation.   History reviewed.  He works in an Insurance underwriter job, mostly Chief Operating Officer.  He is very active and wants to continue to be active.    EXAMINATION: General exam is outlined and included in the chart.  Specifically, healthy 50 year-old male.  Height: 5?8.  Weight: 170 pounds.  Antalgic gait on the right.  Right knee point tender medial joint line.  Positive medial McMurray's.  About a 5 degree flexion contracture secondary to pain.  I am not getting any significant instability to testing these collaterals and cruciate ligaments.  I feel like his ACL does come to a good end point.  Good patella tracking.  Trace effusion.  Neurovascularly intact distally.  The opposite left knee has full motion.  Good stability.  No joint line tenderness.    X-RAYS: Three view standing x-ray of the right knee shows no degenerative change.  No acute changes.    DISPOSITION:  I think he probably has a displaced meniscus tear based on what I am seeing.  Given the mechanical symptoms, the flexion contracture and the fact that he is getting worse rather than better, I want to find out what is going on to pursue treatment.  We have discussed MRI scan.  He is going to talk to me when that is complete.  If in fact he has a meniscus tear we have discussed definitive treatment in some detail.  Procedure, risks,  benefits, complications and anticipated rehab and recovery all outlined.  Final decision about proceeding after we his scan.  He understands.  Cautious activity in the interim.  I will talk to him as soon as the MRI is complete.    Ninetta Lights, M.D.   Addendum:  I saw Nicholas Murray briefly after his MRI.  Although I don't have the official report, I have looked at the scan.  Complex medial meniscus tear.  ACL intact.  All other structures look good.  I went over it with him.  We are going to proceed as planned with arthroscopy for his meniscus tear.  He understands and agrees.  I have told him I am going to wait to get the official report, but I doubt it is going to change what we are going to do.    Ninetta Lights, M.D.

## 2015-03-19 ENCOUNTER — Encounter (HOSPITAL_BASED_OUTPATIENT_CLINIC_OR_DEPARTMENT_OTHER): Payer: Self-pay | Admitting: Anesthesiology

## 2015-03-19 ENCOUNTER — Encounter (HOSPITAL_BASED_OUTPATIENT_CLINIC_OR_DEPARTMENT_OTHER)
Admission: RE | Disposition: A | Discharge status: Home / Self Care | Payer: Self-pay | Source: Ambulatory Visit | Attending: Orthopedic Surgery

## 2015-03-19 ENCOUNTER — Ambulatory Visit (HOSPITAL_BASED_OUTPATIENT_CLINIC_OR_DEPARTMENT_OTHER)
Admission: RE | Admit: 2015-03-19 | Discharge: 2015-03-19 | Disposition: A | Discharge status: Home / Self Care | Payer: BLUE CROSS/BLUE SHIELD | Source: Ambulatory Visit | Attending: Orthopedic Surgery | Admitting: Orthopedic Surgery

## 2015-03-19 ENCOUNTER — Ambulatory Visit (HOSPITAL_BASED_OUTPATIENT_CLINIC_OR_DEPARTMENT_OTHER): Payer: BLUE CROSS/BLUE SHIELD | Admitting: Anesthesiology

## 2015-03-19 DIAGNOSIS — Z885 Allergy status to narcotic agent status: Secondary | ICD-10-CM | POA: Insufficient documentation

## 2015-03-19 DIAGNOSIS — M65861 Other synovitis and tenosynovitis, right lower leg: Secondary | ICD-10-CM | POA: Diagnosis not present

## 2015-03-19 DIAGNOSIS — S83241A Other tear of medial meniscus, current injury, right knee, initial encounter: Secondary | ICD-10-CM | POA: Diagnosis not present

## 2015-03-19 DIAGNOSIS — Z880 Allergy status to penicillin: Secondary | ICD-10-CM | POA: Diagnosis not present

## 2015-03-19 DIAGNOSIS — Y92830 Public park as the place of occurrence of the external cause: Secondary | ICD-10-CM | POA: Diagnosis not present

## 2015-03-19 DIAGNOSIS — Y998 Other external cause status: Secondary | ICD-10-CM | POA: Insufficient documentation

## 2015-03-19 DIAGNOSIS — Y9389 Activity, other specified: Secondary | ICD-10-CM | POA: Diagnosis not present

## 2015-03-19 DIAGNOSIS — X58XXXA Exposure to other specified factors, initial encounter: Secondary | ICD-10-CM | POA: Insufficient documentation

## 2015-03-19 HISTORY — PX: KNEE ARTHROSCOPY WITH MEDIAL MENISECTOMY: SHX5651

## 2015-03-19 HISTORY — DX: Tinnitus, unspecified ear: H93.19

## 2015-03-19 LAB — POCT HEMOGLOBIN-HEMACUE: HEMOGLOBIN: 14.7 g/dL (ref 13.0–17.0)

## 2015-03-19 SURGERY — ARTHROSCOPY, KNEE, WITH MEDIAL MENISCECTOMY
Anesthesia: General | Site: Knee | Laterality: Right

## 2015-03-19 MED ORDER — DEXAMETHASONE SODIUM PHOSPHATE 4 MG/ML IJ SOLN
INTRAMUSCULAR | Status: DC | PRN
  Administered 2015-03-19: 10 mg via INTRAVENOUS

## 2015-03-19 MED ORDER — CEFAZOLIN SODIUM-DEXTROSE 2-3 GM-% IV SOLR
2.0000 g | INTRAVENOUS | Status: AC
  Administered 2015-03-19: 2 g via INTRAVENOUS

## 2015-03-19 MED ORDER — LACTATED RINGERS IV SOLN
INTRAVENOUS | Status: DC
  Administered 2015-03-19: 09:00:00 via INTRAVENOUS

## 2015-03-19 MED ORDER — ONDANSETRON HCL 4 MG PO TABS: 4.0000 mg | ORAL_TABLET | Freq: Four times a day (QID) | ORAL | Status: DC | PRN

## 2015-03-19 MED ORDER — ONDANSETRON HCL 4 MG/2ML IJ SOLN
INTRAMUSCULAR | Status: DC | PRN
  Administered 2015-03-19: 4 mg via INTRAVENOUS

## 2015-03-19 MED ORDER — SCOPOLAMINE 1 MG/3DAYS TD PT72: 1.0000 | MEDICATED_PATCH | Freq: Once | TRANSDERMAL | Status: DC | PRN

## 2015-03-19 MED ORDER — ONDANSETRON HCL 4 MG PO TABS: 4.0000 mg | ORAL_TABLET | Freq: Three times a day (TID) | ORAL | Status: DC | PRN

## 2015-03-19 MED ORDER — KETOROLAC TROMETHAMINE 30 MG/ML IJ SOLN
INTRAMUSCULAR | Status: AC
  Filled 2015-03-19: qty 1

## 2015-03-19 MED ORDER — LIDOCAINE HCL (CARDIAC) 20 MG/ML IV SOLN
INTRAVENOUS | Status: DC | PRN
  Administered 2015-03-19: 50 mg via INTRAVENOUS

## 2015-03-19 MED ORDER — GLYCOPYRROLATE 0.2 MG/ML IJ SOLN: 0.2000 mg | Freq: Once | INTRAMUSCULAR | Status: DC | PRN

## 2015-03-19 MED ORDER — FENTANYL CITRATE (PF) 100 MCG/2ML IJ SOLN
INTRAMUSCULAR | Status: AC
  Filled 2015-03-19: qty 4

## 2015-03-19 MED ORDER — BUPIVACAINE HCL (PF) 0.5 % IJ SOLN
INTRAMUSCULAR | Status: DC | PRN
  Administered 2015-03-19: 19 mL

## 2015-03-19 MED ORDER — LACTATED RINGERS IV SOLN: INTRAVENOUS | Status: DC

## 2015-03-19 MED ORDER — METOCLOPRAMIDE HCL 5 MG PO TABS: 5.0000 mg | ORAL_TABLET | Freq: Three times a day (TID) | ORAL | Status: DC | PRN

## 2015-03-19 MED ORDER — MIDAZOLAM HCL 2 MG/2ML IJ SOLN
1.0000 mg | INTRAMUSCULAR | Status: DC | PRN
  Administered 2015-03-19: 2 mg via INTRAVENOUS

## 2015-03-19 MED ORDER — METHOCARBAMOL 500 MG PO TABS: 500.0000 mg | ORAL_TABLET | Freq: Four times a day (QID) | ORAL | Status: DC | PRN

## 2015-03-19 MED ORDER — CEFAZOLIN SODIUM-DEXTROSE 2-3 GM-% IV SOLR
INTRAVENOUS | Status: AC
  Filled 2015-03-19: qty 50

## 2015-03-19 MED ORDER — DEXAMETHASONE SODIUM PHOSPHATE 10 MG/ML IJ SOLN
INTRAMUSCULAR | Status: AC
  Filled 2015-03-19: qty 1

## 2015-03-19 MED ORDER — OXYCODONE-ACETAMINOPHEN 5-325 MG PO TABS
1.0000 | ORAL_TABLET | ORAL | Status: DC | PRN
  Administered 2015-03-19: 1 via ORAL

## 2015-03-19 MED ORDER — MIDAZOLAM HCL 2 MG/2ML IJ SOLN
INTRAMUSCULAR | Status: AC
  Filled 2015-03-19: qty 4

## 2015-03-19 MED ORDER — METHYLPREDNISOLONE ACETATE 80 MG/ML IJ SUSP
INTRAMUSCULAR | Status: DC | PRN
  Administered 2015-03-19: 80 mg via INTRA_ARTICULAR

## 2015-03-19 MED ORDER — CHLORHEXIDINE GLUCONATE 4 % EX LIQD: 60.0000 mL | Freq: Once | CUTANEOUS | Status: DC

## 2015-03-19 MED ORDER — ONDANSETRON HCL 4 MG/2ML IJ SOLN: 4.0000 mg | Freq: Four times a day (QID) | INTRAMUSCULAR | Status: DC | PRN

## 2015-03-19 MED ORDER — METOCLOPRAMIDE HCL 5 MG/ML IJ SOLN: 5.0000 mg | Freq: Three times a day (TID) | INTRAMUSCULAR | Status: DC | PRN

## 2015-03-19 MED ORDER — LIDOCAINE HCL (CARDIAC) 20 MG/ML IV SOLN
INTRAVENOUS | Status: AC
  Filled 2015-03-19: qty 5

## 2015-03-19 MED ORDER — ONDANSETRON HCL 4 MG/2ML IJ SOLN
INTRAMUSCULAR | Status: AC
  Filled 2015-03-19: qty 2

## 2015-03-19 MED ORDER — HYDROMORPHONE HCL 1 MG/ML IJ SOLN: 0.5000 mg | INTRAMUSCULAR | Status: DC | PRN

## 2015-03-19 MED ORDER — FENTANYL CITRATE (PF) 100 MCG/2ML IJ SOLN
50.0000 ug | INTRAMUSCULAR | Status: DC | PRN
  Administered 2015-03-19: 100 ug via INTRAVENOUS

## 2015-03-19 MED ORDER — METHOCARBAMOL 1000 MG/10ML IJ SOLN: 500.0000 mg | Freq: Four times a day (QID) | INTRAVENOUS | Status: DC | PRN

## 2015-03-19 MED ORDER — PROPOFOL 10 MG/ML IV BOLUS
INTRAVENOUS | Status: DC | PRN
  Administered 2015-03-19: 200 mg via INTRAVENOUS

## 2015-03-19 MED ORDER — OXYCODONE-ACETAMINOPHEN 5-325 MG PO TABS: 1.0000 | ORAL_TABLET | ORAL | Status: DC | PRN

## 2015-03-19 MED ORDER — FENTANYL CITRATE (PF) 100 MCG/2ML IJ SOLN
INTRAMUSCULAR | Status: AC
  Filled 2015-03-19: qty 2

## 2015-03-19 MED ORDER — FENTANYL CITRATE (PF) 100 MCG/2ML IJ SOLN
25.0000 ug | INTRAMUSCULAR | Status: DC | PRN
  Administered 2015-03-19 (×2): 50 ug via INTRAVENOUS

## 2015-03-19 MED ORDER — PROPOFOL 10 MG/ML IV BOLUS
INTRAVENOUS | Status: AC
  Filled 2015-03-19: qty 20

## 2015-03-19 MED ORDER — PROMETHAZINE HCL 25 MG/ML IJ SOLN
6.2500 mg | INTRAMUSCULAR | Status: DC | PRN
Start: 2015-03-19 — End: 2015-03-19

## 2015-03-19 MED ORDER — METHYLPREDNISOLONE ACETATE 80 MG/ML IJ SUSP
INTRAMUSCULAR | Status: AC
  Filled 2015-03-19: qty 1

## 2015-03-19 MED ORDER — OXYCODONE-ACETAMINOPHEN 5-325 MG PO TABS
ORAL_TABLET | ORAL | Status: AC
  Filled 2015-03-19: qty 1

## 2015-03-19 MED ORDER — METHYLPREDNISOLONE ACETATE 40 MG/ML IJ SUSP
INTRAMUSCULAR | Status: AC
  Filled 2015-03-19: qty 1

## 2015-03-19 MED ORDER — KETOROLAC TROMETHAMINE 30 MG/ML IJ SOLN
INTRAMUSCULAR | Status: DC | PRN
  Administered 2015-03-19: 30 mg via INTRAVENOUS

## 2015-03-19 SURGICAL SUPPLY — 41 items
BANDAGE ELASTIC 6 VELCRO ST LF (GAUZE/BANDAGES/DRESSINGS) ×2 IMPLANT
BLADE CUDA 5.5 (BLADE) IMPLANT
BLADE CUDA GRT WHITE 3.5 (BLADE) IMPLANT
BLADE CUTTER GATOR 3.5 (BLADE) ×2 IMPLANT
BLADE CUTTER MENIS 5.5 (BLADE) IMPLANT
BLADE GREAT WHITE 4.2 (BLADE) ×2 IMPLANT
BUR OVAL 4.0 (BURR) IMPLANT
CUTTER MENISCUS  4.2MM (BLADE)
CUTTER MENISCUS 4.2MM (BLADE) IMPLANT
DRAPE ARTHROSCOPY W/POUCH 90 (DRAPES) ×2 IMPLANT
DURAPREP 26ML APPLICATOR (WOUND CARE) ×2 IMPLANT
ELECT MENISCUS 165MM 90D (ELECTRODE) IMPLANT
ELECT REM PT RETURN 9FT ADLT (ELECTROSURGICAL)
ELECTRODE REM PT RTRN 9FT ADLT (ELECTROSURGICAL) IMPLANT
GAUZE SPONGE 4X4 12PLY STRL (GAUZE/BANDAGES/DRESSINGS) ×2 IMPLANT
GAUZE XEROFORM 1X8 LF (GAUZE/BANDAGES/DRESSINGS) ×2 IMPLANT
GLOVE BIOGEL PI IND STRL 7.0 (GLOVE) ×1 IMPLANT
GLOVE BIOGEL PI INDICATOR 7.0 (GLOVE) ×3
GLOVE ECLIPSE 7.0 STRL STRAW (GLOVE) ×2 IMPLANT
GLOVE ORTHO TXT STRL SZ7.5 (GLOVE) ×2 IMPLANT
GLOVE SURG ORTHO 8.0 STRL STRW (GLOVE) ×2 IMPLANT
GLOVE SURG SS PI 7.0 STRL IVOR (GLOVE) ×1 IMPLANT
GOWN STRL REUS W/ TWL LRG LVL3 (GOWN DISPOSABLE) ×2 IMPLANT
GOWN STRL REUS W/ TWL XL LVL3 (GOWN DISPOSABLE) ×1 IMPLANT
GOWN STRL REUS W/TWL LRG LVL3 (GOWN DISPOSABLE) ×4
GOWN STRL REUS W/TWL XL LVL3 (GOWN DISPOSABLE) ×2
HOLDER KNEE FOAM BLUE (MISCELLANEOUS) ×2 IMPLANT
IV NS IRRIG 3000ML ARTHROMATIC (IV SOLUTION) ×3 IMPLANT
KNEE WRAP E Z 3 GEL PACK (MISCELLANEOUS) ×2 IMPLANT
MANIFOLD NEPTUNE II (INSTRUMENTS) ×2 IMPLANT
NDL SAFETY ECLIPSE 18X1.5 (NEEDLE) IMPLANT
NEEDLE HYPO 18GX1.5 SHARP (NEEDLE) ×4
PACK ARTHROSCOPY DSU (CUSTOM PROCEDURE TRAY) ×2 IMPLANT
PACK BASIN DAY SURGERY FS (CUSTOM PROCEDURE TRAY) ×2 IMPLANT
PENCIL BUTTON HOLSTER BLD 10FT (ELECTRODE) IMPLANT
SET ARTHROSCOPY TUBING (MISCELLANEOUS) ×2
SET ARTHROSCOPY TUBING LN (MISCELLANEOUS) ×1 IMPLANT
SUT ETHILON 3 0 PS 1 (SUTURE) ×2 IMPLANT
SUT VIC AB 3-0 FS2 27 (SUTURE) IMPLANT
TOWEL OR 17X24 6PK STRL BLUE (TOWEL DISPOSABLE) ×2 IMPLANT
WATER STERILE IRR 1000ML POUR (IV SOLUTION) ×2 IMPLANT

## 2015-03-19 NOTE — H&P (View-Only) (Signed)
Nicholas Murray is a new patient to the office.  Healthy 49 year-old active male.  He comes in for evaluation and treatment recommendation for a new acute injury, right knee.  Going down a child's slide and landed awkwardly with a vertical load twist hyperextension injury to his right knee.  Initial pain and swelling which improved.  He felt like this was almost getting normal, but then he turned and he has had marked persistent symptoms.  All of this is medial side.  He does not like full extension.  Really not describing instability.  No significant previous issues with his knee.  Comes in for evaluation and treatment recommendation.   History reviewed.  He works in an insurance job, mostly deskwork.  He is very active and wants to continue to be active.    EXAMINATION: General exam is outlined and included in the chart.  Specifically, healthy 49 year-old male.  Height: 5?8.  Weight: 170 pounds.  Antalgic gait on the right.  Right knee point tender medial joint line.  Positive medial McMurray's.  About a 5 degree flexion contracture secondary to pain.  I am not getting any significant instability to testing these collaterals and cruciate ligaments.  I feel like his ACL does come to a good end point.  Good patella tracking.  Trace effusion.  Neurovascularly intact distally.  The opposite left knee has full motion.  Good stability.  No joint line tenderness.    X-RAYS: Three view standing x-ray of the right knee shows no degenerative change.  No acute changes.    DISPOSITION:  I think he probably has a displaced meniscus tear based on what I am seeing.  Given the mechanical symptoms, the flexion contracture and the fact that he is getting worse rather than better, I want to find out what is going on to pursue treatment.  We have discussed MRI scan.  He is going to talk to me when that is complete.  If in fact he has a meniscus tear we have discussed definitive treatment in some detail.  Procedure, risks,  benefits, complications and anticipated rehab and recovery all outlined.  Final decision about proceeding after we his scan.  He understands.  Cautious activity in the interim.  I will talk to him as soon as the MRI is complete.    Daniel F. Murphy, M.D.  

## 2015-03-19 NOTE — Anesthesia Postprocedure Evaluation (Signed)
  Anesthesia Post-op Note  Patient: Nicholas Murray  Procedure(s) Performed: Procedure(s): RIGHT KNEE ARTHROSCOPY WITH MEDIAL MENISECTOMY (Right)  Patient Location: PACU  Anesthesia Type:General  Level of Consciousness: awake  Airway and Oxygen Therapy: Patient Spontanous Breathing  Post-op Pain: mild  Post-op Assessment: Post-op Vital signs reviewed     RLE Motor Response: Purposeful movement RLE Sensation: Decreased, Numbness      Post-op Vital Signs: Reviewed  Last Vitals:  Filed Vitals:   03/19/15 1115  BP:   Pulse: 62  Temp:   Resp: 14    Complications: No apparent anesthesia complications

## 2015-03-19 NOTE — Transfer of Care (Signed)
Immediate Anesthesia Transfer of Care Note  Patient: Nicholas Murray  Procedure(s) Performed: Procedure(s): RIGHT KNEE ARTHROSCOPY WITH MEDIAL MENISECTOMY (Right)  Patient Location: PACU  Anesthesia Type:General  Level of Consciousness: awake and sedated  Airway & Oxygen Therapy: Patient Spontanous Breathing and Patient connected to face mask oxygen  Post-op Assessment: Report given to RN and Post -op Vital signs reviewed and stable  Post vital signs: Reviewed and stable  Last Vitals:  Filed Vitals:   03/19/15 0859  BP: 121/76  Pulse: 65  Temp: 36.7 C  Resp: 18    Complications: No apparent anesthesia complications

## 2015-03-19 NOTE — Anesthesia Procedure Notes (Signed)
Procedure Name: LMA Insertion Performed by: Terrance Mass Pre-anesthesia Checklist: Patient identified, Timeout performed, Emergency Drugs available, Suction available and Patient being monitored Oxygen Delivery Method: Circle system utilized Preoxygenation: Pre-oxygenation with 100% oxygen Intubation Type: IV induction Ventilation: Mask ventilation without difficulty LMA: LMA inserted LMA Size: 5.0 Tube type: Oral Number of attempts: 1 Placement Confirmation: positive ETCO2 Tube secured with: Tape Dental Injury: Teeth and Oropharynx as per pre-operative assessment

## 2015-03-19 NOTE — Interval H&P Note (Signed)
History and Physical Interval Note:  03/19/2015 7:30 AM  Nicholas Murray  has presented today for surgery, with the diagnosis of other tear of medial meniscus, current injury, right knee S83.241  The various methods of treatment have been discussed with the patient and family. After consideration of risks, benefits and other options for treatment, the patient has consented to  Procedure(s): RIGHT KNEE ARTHROSCOPY WITH MEDIAL MENISECTOMY (Right) as a surgical intervention .  The patient's history has been reviewed, patient examined, no change in status, stable for surgery.  I have reviewed the patient's chart and labs.  Questions were answered to the patient's satisfaction.     Nicholas Murray

## 2015-03-19 NOTE — Anesthesia Preprocedure Evaluation (Signed)
Anesthesia Evaluation  Patient identified by MRN, date of birth, ID band Patient awake    Reviewed: Allergy & Precautions, NPO status , Patient's Chart, lab work & pertinent test results  Airway Mallampati: II  TM Distance: >3 FB Neck ROM: Full    Dental   Pulmonary neg pulmonary ROS,  breath sounds clear to auscultation        Cardiovascular negative cardio ROS  Rhythm:Regular Rate:Normal     Neuro/Psych    GI/Hepatic negative GI ROS, Neg liver ROS,   Endo/Other    Renal/GU negative Renal ROS     Musculoskeletal   Abdominal   Peds  Hematology   Anesthesia Other Findings   Reproductive/Obstetrics                             Anesthesia Physical Anesthesia Plan  ASA: I  Anesthesia Plan: General   Post-op Pain Management:    Induction:   Airway Management Planned: LMA  Additional Equipment:   Intra-op Plan:   Post-operative Plan: Extubation in OR  Informed Consent: I have reviewed the patients History and Physical, chart, labs and discussed the procedure including the risks, benefits and alternatives for the proposed anesthesia with the patient or authorized representative who has indicated his/her understanding and acceptance.   Dental advisory given  Plan Discussed with: CRNA and Anesthesiologist  Anesthesia Plan Comments:         Anesthesia Quick Evaluation

## 2015-03-19 NOTE — Discharge Instructions (Signed)

## 2015-03-20 ENCOUNTER — Encounter (HOSPITAL_BASED_OUTPATIENT_CLINIC_OR_DEPARTMENT_OTHER): Payer: Self-pay | Admitting: Orthopedic Surgery

## 2015-03-20 NOTE — Op Note (Signed)
NAME:  Nicholas Murray, Nicholas Murray NO.:  [id number]  MEDICAL RECORD NO.:  [id number]  LOCATION:                               FACILITY:  [facility]  PHYSICIAN:  Ninetta Lights, M.D. DATE OF BIRTH:  06-Jun-1966  DATE OF PROCEDURE:  03/19/2015 DATE OF DISCHARGE:  03/19/2015                              OPERATIVE REPORT   PREOPERATIVE DIAGNOSIS:  Medial meniscus tear with small parameniscal cyst, right knee.  POSTOPERATIVE DIAGNOSIS:  Medial meniscus tear with small parameniscal cyst, right knee.  Some reactive synovitis.  PROCEDURES:  Right knee exam under anesthesia, arthroscopy.  Partial medial meniscectomy with decompression of meniscal cyst arthroscopically.  Partial synovectomy.  SURGEON:  Ninetta Lights, M.D.  ASSISTANT:  Elmyra Ricks, PA  ANESTHESIA:  General.  BLOOD LOSS:  Minimal.  SPECIMENS:  None.  CULTURES:  None.  COMPLICATIONS:  None.  DRESSING:  Soft compressive.  TOURNIQUET:  Not employed.  DESCRIPTION OF PROCEDURE:  The patient was brought to the operating room and placed on the operating table in supine position.  After adequate anesthesia had been obtained, leg holder was applied.  Leg was prepped and draped in usual sterile fashion.  Two portals, one each medial and lateral parapatellar.  Arthroscope was introduced.  Knee was distended and inspected.  Good patellar tracking.  Some reactive synovitis in the front of the knee debrided.  Articular cartilage looked good throughout the entire knee other than a little roughening medial femoral condyle from the meniscus tear.  Lateral meniscus and cruciate ligaments were intact.  Marked complex tear in medial meniscus and posterior half. Taken out to a stable rim, tapered into remaining meniscus.  The meniscal cyst could be accessed after debridement of meniscus and then decompressed arthroscopically.  Entire knee was examined.  No other findings were appreciated.  Instruments and fluids were  removed.  Portals were closed with nylon.  Knee was injected with Depo-Medrol and Marcaine.  Anesthesia was reversed.  Brought to the recovery room.  Tolerated the surgery well.  No complications.     Ninetta Lights, M.D.     DFM/MEDQ  D:  03/19/2015  T:  03/19/2015  Job:  [id number]

## 2016-09-30 ENCOUNTER — Other Ambulatory Visit: Payer: Self-pay | Admitting: Internal Medicine

## 2016-09-30 DIAGNOSIS — E785 Hyperlipidemia, unspecified: Secondary | ICD-10-CM

## 2016-10-07 ENCOUNTER — Other Ambulatory Visit: Payer: BLUE CROSS/BLUE SHIELD

## 2016-10-14 ENCOUNTER — Ambulatory Visit
Admission: RE | Admit: 2016-10-14 | Discharge: 2016-10-14 | Disposition: A | Discharge status: Home / Self Care | Payer: BLUE CROSS/BLUE SHIELD | Source: Ambulatory Visit | Attending: Internal Medicine | Admitting: Internal Medicine

## 2016-10-14 DIAGNOSIS — E785 Hyperlipidemia, unspecified: Secondary | ICD-10-CM

## 2017-03-22 ENCOUNTER — Other Ambulatory Visit: Payer: Self-pay | Admitting: Internal Medicine

## 2017-03-22 DIAGNOSIS — M5416 Radiculopathy, lumbar region: Secondary | ICD-10-CM

## 2017-03-22 DIAGNOSIS — N50819 Testicular pain, unspecified: Secondary | ICD-10-CM

## 2017-03-30 ENCOUNTER — Ambulatory Visit
Admission: RE | Admit: 2017-03-30 | Discharge: 2017-03-30 | Disposition: A | Discharge status: Home / Self Care | Payer: BLUE CROSS/BLUE SHIELD | Source: Ambulatory Visit | Attending: Internal Medicine | Admitting: Internal Medicine

## 2017-03-30 DIAGNOSIS — M5416 Radiculopathy, lumbar region: Secondary | ICD-10-CM

## 2017-03-30 DIAGNOSIS — N50819 Testicular pain, unspecified: Secondary | ICD-10-CM

## 2017-12-01 DIAGNOSIS — M25531 Pain in right wrist: Secondary | ICD-10-CM | POA: Insufficient documentation

## 2017-12-01 DIAGNOSIS — M25532 Pain in left wrist: Secondary | ICD-10-CM

## 2017-12-19 ENCOUNTER — Encounter: Payer: Self-pay | Admitting: Gastroenterology

## 2018-02-08 ENCOUNTER — Ambulatory Visit (AMBULATORY_SURGERY_CENTER): Payer: Self-pay | Admitting: *Deleted

## 2018-02-08 VITALS — Ht 68.0 in | Wt 180.0 lb

## 2018-02-08 DIAGNOSIS — Z1211 Encounter for screening for malignant neoplasm of colon: Secondary | ICD-10-CM

## 2018-02-08 MED ORDER — PEG-KCL-NACL-NASULF-NA ASC-C 140 G PO SOLR: 1.0000 | Freq: Once | ORAL | 0 refills | Status: AC

## 2018-02-08 NOTE — Progress Notes (Signed)
Patient denies any allergies to eggs or soy. Patient denies any problems with anesthesia/sedation. Patient denies any oxygen use at home. Patient denies taking any diet/weight loss medications or blood thinners. EMMI education assisgned to patient on colonoscopy, this was explained and instructions given to patient. 

## 2018-02-22 ENCOUNTER — Ambulatory Visit (AMBULATORY_SURGERY_CENTER): Payer: BLUE CROSS/BLUE SHIELD | Admitting: Gastroenterology

## 2018-02-22 ENCOUNTER — Encounter: Payer: Self-pay | Admitting: Gastroenterology

## 2018-02-22 VITALS — BP 108/72 | HR 62 | Temp 97.1°F | Resp 12 | Ht 68.0 in | Wt 180.0 lb

## 2018-02-22 DIAGNOSIS — Z1211 Encounter for screening for malignant neoplasm of colon: Secondary | ICD-10-CM

## 2018-02-22 DIAGNOSIS — D122 Benign neoplasm of ascending colon: Secondary | ICD-10-CM

## 2018-02-22 MED ORDER — SODIUM CHLORIDE 0.9 % IV SOLN: 500.0000 mL | Freq: Once | INTRAVENOUS | Status: AC

## 2018-02-22 NOTE — Progress Notes (Signed)
Called to room to assist during endoscopic procedure.  Patient ID and intended procedure confirmed with present staff. Received instructions for my participation in the procedure from the performing physician.  

## 2018-02-22 NOTE — Patient Instructions (Signed)
YOU HAD AN ENDOSCOPIC PROCEDURE TODAY AT THE [facility]:   Refer to the procedure report that was given to you for any specific questions about what was found during the examination.  If the procedure report does not answer your questions, please call your gastroenterologist to clarify.  If you requested that your care partner not be given the details of your procedure findings, then the procedure report has been included in a sealed envelope for you to review at your convenience later.  YOU SHOULD EXPECT: Some feelings of bloating in the abdomen. Passage of more gas than usual.  Walking can help get rid of the air that was put into your GI tract during the procedure and reduce the bloating. If you had a lower endoscopy (such as a colonoscopy or flexible sigmoidoscopy) you may notice spotting of blood in your stool or on the toilet paper. If you underwent a bowel prep for your procedure, you may not have a normal bowel movement for a few days.  Please Note:  You might notice some irritation and congestion in your nose or some drainage.  This is from the oxygen used during your procedure.  There is no need for concern and it should clear up in a day or so.  SYMPTOMS TO REPORT IMMEDIATELY:   Following lower endoscopy (colonoscopy or flexible sigmoidoscopy):  Excessive amounts of blood in the stool  Significant tenderness or worsening of abdominal pains  Swelling of the abdomen that is new, acute  Fever of 100F or higher  For urgent or emergent issues, a gastroenterologist can be reached at any hour by calling [phone].   DIET:  We do recommend a small meal at first, but then you may proceed to your regular diet.  Drink plenty of fluids but you should avoid alcoholic beverages for 24 hours.  MEDICATIONS: Continue present medications.  Please see handouts given to you by your recovery nurse.  ACTIVITY:  You should plan to take it easy for the rest of today and you should NOT  DRIVE or use heavy machinery until tomorrow (because of the sedation medicines used during the test).    FOLLOW UP: Our staff will call the number listed on your records the next business day following your procedure to check on you and address any questions or concerns that you may have regarding the information given to you following your procedure. If we do not reach you, we will leave a message.  However, if you are feeling well and you are not experiencing any problems, there is no need to return our call.  We will assume that you have returned to your regular daily activities without incident.  If any biopsies were taken you will be contacted by phone or by letter within the next 1-3 weeks.  Please call us at [phone] if you have not heard about the biopsies in 3 weeks.   Thank you for allowing us to provide for your healthcare needs today.   SIGNATURES/CONFIDENTIALITY: You and/or your care partner have signed paperwork which will be entered into your electronic medical record.  These signatures attest to the fact that that the information above on your After Visit Summary has been reviewed and is understood.  Full responsibility of the confidentiality of this discharge information lies with you and/or your care-partner. 

## 2018-02-22 NOTE — Op Note (Signed)
[facility] Patient Name: Nicholas Murray Procedure Date: 02/22/2018 8:55 AM MRN: [id number] Endoscopist: Mallie Mussel L. Loletha Carrow , MD Age: 52 Referring MD:  Date of Birth: 17-Apr-1966 Gender: Male Account #: [id number] Procedure:                Colonoscopy Indications:              Screening for colorectal malignant neoplasm, This                            is the patient's first colonoscopy Medicines:                Monitored Anesthesia Care Procedure:                Pre-Anesthesia Assessment:                           - Prior to the procedure, a History and Physical                            was performed, and patient medications and                            allergies were reviewed. The patient's tolerance of                            previous anesthesia was also reviewed. The risks                            and benefits of the procedure and the sedation                            options and risks were discussed with the patient.                            All questions were answered, and informed consent                            was obtained. Prior Anticoagulants: The patient has                            taken no previous anticoagulant or antiplatelet                            agents. ASA Grade Assessment: I - A normal, healthy                            patient. After reviewing the risks and benefits,                            the patient was deemed in satisfactory condition to                            undergo the procedure.  After obtaining informed consent, the colonoscope                            was passed under direct vision. Throughout the                            procedure, the patient's blood pressure, pulse, and                            oxygen saturations were monitored continuously. The                            Colonoscope was introduced through the anus and                            advanced to the the cecum,  identified by                            appendiceal orifice and ileocecal valve. The                            colonoscopy was performed without difficulty. The                            patient tolerated the procedure well. The quality                            of the bowel preparation was excellent. The                            ileocecal valve, appendiceal orifice, and rectum                            were photographed. The quality of the bowel                            preparation was evaluated using the BBPS [facility]                            Bowel Preparation Scale) with scores of: Right                            Colon = 3, Transverse Colon = 3 and Left Colon = 3                            (entire mucosa seen well with no residual staining,                            small fragments of stool or opaque liquid). The                            total BBPS score equals 9. The bowel preparation  used was Plenvu. Scope In: 8:59:17 AM Scope Out: 9:13:25 AM Scope Withdrawal Time: 0 hours 11 minutes 50 seconds  Total Procedure Duration: 0 hours 14 minutes 8 seconds  Findings:                 The perianal and digital rectal examinations were                            normal.                           A 2 mm polyp was found in the mid ascending colon.                            The polyp was sessile. The polyp was removed with a                            cold biopsy forceps. Resection and retrieval were                            complete.                           The exam was otherwise without abnormality on                            direct and retroflexion views. Complications:            No immediate complications. Estimated Blood Loss:     Estimated blood loss: none. Impression:               - One 2 mm polyp in the mid ascending colon,                            removed with a cold biopsy forceps. Resected and                            retrieved.                            - The examination was otherwise normal on direct                            and retroflexion views. Recommendation:           - Patient has a contact number available for                            emergencies. The signs and symptoms of potential                            delayed complications were discussed with the                            patient. Return to normal activities tomorrow.  Written discharge instructions were provided to the                            patient.                           - Resume previous diet.                           - Continue present medications.                           - Await pathology results.                           - Repeat colonoscopy is recommended for                            surveillance. The colonoscopy date will be                            determined after pathology results from today's                            exam become available for review. [name], MD 02/22/2018 9:16:43 AM This report has been signed electronically.

## 2018-02-22 NOTE — Progress Notes (Signed)
Report to PACU, RN, vss, BBS= Clear.  

## 2018-02-22 NOTE — Progress Notes (Signed)
Pt's states no medical or surgical changes since previsit or office visit. 

## 2018-02-23 ENCOUNTER — Telehealth: Payer: Self-pay | Admitting: *Deleted

## 2018-02-23 NOTE — Telephone Encounter (Signed)
  Follow up Call-  Call back number 02/22/2018  Post procedure Call Back phone  # [phone]  Permission to leave phone message Yes  Some recent data might be hidden     Patient questions:  Do you have a fever, pain , or abdominal swelling? No. Pain Score  0 *  Have you tolerated food without any problems? Yes.    Have you been able to return to your normal activities? Yes.    Do you have any questions about your discharge instructions: Diet   No. Medications  No. Follow up visit  No.  Do you have questions or concerns about your Care? No.  Actions: * If pain score is 4 or above: No action needed, pain <4.

## 2018-03-02 ENCOUNTER — Encounter: Payer: Self-pay | Admitting: Gastroenterology

## 2018-05-12 ENCOUNTER — Emergency Department (HOSPITAL_COMMUNITY)
Admission: EM | Admit: 2018-05-12 | Discharge: 2018-05-13 | Disposition: A | Discharge status: Home / Self Care | Payer: BLUE CROSS/BLUE SHIELD | Attending: Emergency Medicine | Admitting: Emergency Medicine

## 2018-05-12 ENCOUNTER — Other Ambulatory Visit: Payer: Self-pay

## 2018-05-12 ENCOUNTER — Encounter (HOSPITAL_COMMUNITY): Payer: Self-pay

## 2018-05-12 DIAGNOSIS — S0990XA Unspecified injury of head, initial encounter: Secondary | ICD-10-CM | POA: Diagnosis present

## 2018-05-12 DIAGNOSIS — Z23 Encounter for immunization: Secondary | ICD-10-CM | POA: Diagnosis not present

## 2018-05-12 DIAGNOSIS — Y999 Unspecified external cause status: Secondary | ICD-10-CM | POA: Insufficient documentation

## 2018-05-12 DIAGNOSIS — S060X1A Concussion with loss of consciousness of 30 minutes or less, initial encounter: Secondary | ICD-10-CM | POA: Diagnosis not present

## 2018-05-12 DIAGNOSIS — Y9389 Activity, other specified: Secondary | ICD-10-CM | POA: Insufficient documentation

## 2018-05-12 DIAGNOSIS — S0292XA Unspecified fracture of facial bones, initial encounter for closed fracture: Secondary | ICD-10-CM

## 2018-05-12 DIAGNOSIS — Y929 Unspecified place or not applicable: Secondary | ICD-10-CM | POA: Insufficient documentation

## 2018-05-12 LAB — I-STAT CHEM 8, ED
BUN: 17 mg/dL (ref 6–20)
CALCIUM ION: 0.93 mmol/L — AB (ref 1.15–1.40)
Chloride: 109 mmol/L (ref 98–111)
Creatinine, Ser: 1.5 mg/dL — ABNORMAL HIGH (ref 0.61–1.24)
Glucose, Bld: 103 mg/dL — ABNORMAL HIGH (ref 70–99)
HCT: 40 % (ref 39.0–52.0)
HEMOGLOBIN: 13.6 g/dL (ref 13.0–17.0)
Potassium: 4.4 mmol/L (ref 3.5–5.1)
SODIUM: 138 mmol/L (ref 135–145)
TCO2: 24 mmol/L (ref 22–32)

## 2018-05-12 LAB — I-STAT CG4 LACTIC ACID, ED: LACTIC ACID, VENOUS: 2.32 mmol/L — AB (ref 0.5–1.9)

## 2018-05-12 MED ORDER — FENTANYL CITRATE (PF) 100 MCG/2ML IJ SOLN
50.0000 ug | Freq: Once | INTRAMUSCULAR | Status: AC
  Administered 2018-05-13: 50 ug via INTRAVENOUS
  Filled 2018-05-12: qty 2

## 2018-05-12 MED ORDER — TETANUS-DIPHTH-ACELL PERTUSSIS 5-2.5-18.5 LF-MCG/0.5 IM SUSP
0.5000 mL | Freq: Once | INTRAMUSCULAR | Status: AC
  Administered 2018-05-13: 0.5 mL via INTRAMUSCULAR
  Filled 2018-05-12: qty 0.5
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
## 2018-05-13 ENCOUNTER — Emergency Department (HOSPITAL_COMMUNITY): Payer: BLUE CROSS/BLUE SHIELD

## 2018-05-13 LAB — URINALYSIS, ROUTINE W REFLEX MICROSCOPIC
BILIRUBIN URINE: NEGATIVE
Glucose, UA: NEGATIVE mg/dL
Ketones, ur: NEGATIVE mg/dL
Leukocytes, UA: NEGATIVE
Nitrite: NEGATIVE
Protein, ur: NEGATIVE mg/dL
SPECIFIC GRAVITY, URINE: 1.011 (ref 1.005–1.030)
pH: 5 (ref 5.0–8.0)

## 2018-05-13 LAB — COMPREHENSIVE METABOLIC PANEL
ALT: 13 U/L (ref 0–44)
AST: 40 U/L (ref 15–41)
Albumin: 3.9 g/dL (ref 3.5–5.0)
Alkaline Phosphatase: 44 U/L (ref 38–126)
Anion gap: 12 (ref 5–15)
BUN: 13 mg/dL (ref 6–20)
CALCIUM: 8.7 mg/dL — AB (ref 8.9–10.3)
CHLORIDE: 109 mmol/L (ref 98–111)
CO2: 18 mmol/L — AB (ref 22–32)
Creatinine, Ser: 1.18 mg/dL (ref 0.61–1.24)
GFR calc non Af Amer: >60 mL/min (ref >60)
Glucose, Bld: 97 mg/dL (ref 70–99)
Potassium: 4.3 mmol/L (ref 3.5–5.1)
SODIUM: 139 mmol/L (ref 135–145)
Total Bilirubin: 1.3 mg/dL — ABNORMAL HIGH (ref 0.3–1.2)
Total Protein: 5.7 g/dL — ABNORMAL LOW (ref 6.5–8.1)

## 2018-05-13 LAB — PROTIME-INR
INR: 0.9
PROTHROMBIN TIME: 12.1 s (ref 11.4–15.2)

## 2018-05-13 LAB — CBC
HEMATOCRIT: 42.3 % (ref 39.0–52.0)
Hemoglobin: 14.1 g/dL (ref 13.0–17.0)
MCH: 30.7 pg (ref 26.0–34.0)
MCHC: 33.3 g/dL (ref 30.0–36.0)
MCV: 92 fL (ref 80.0–100.0)
PLATELETS: 196 10*3/uL (ref 150–400)
RBC: 4.6 MIL/uL (ref 4.22–5.81)
RDW: 12.6 % (ref 11.5–15.5)
WBC: 9 10*3/uL (ref 4.0–10.5)
nRBC: 0 % (ref 0.0–0.2)

## 2018-05-13 LAB — ETHANOL: Alcohol, Ethyl (B): 160 mg/dL — ABNORMAL HIGH (ref <10)

## 2018-05-13 MED ORDER — LIDOCAINE-EPINEPHRINE-TETRACAINE (LET) SOLUTION
3.0000 mL | Freq: Once | NASAL | Status: AC
  Administered 2018-05-13: 3 mL via TOPICAL
  Filled 2018-05-13: qty 3

## 2018-05-13 MED ORDER — LACTATED RINGERS IV BOLUS
2000.0000 mL | Freq: Once | INTRAVENOUS | Status: AC
  Administered 2018-05-13: 2000 mL via INTRAVENOUS

## 2018-05-13 MED ORDER — CEPHALEXIN 250 MG PO CAPS
500.0000 mg | ORAL_CAPSULE | Freq: Once | ORAL | Status: AC
  Administered 2018-05-13: 500 mg via ORAL
  Filled 2018-05-13: qty 2

## 2018-05-13 MED ORDER — CEPHALEXIN 500 MG PO CAPS: 500.0000 mg | ORAL_CAPSULE | Freq: Four times a day (QID) | ORAL | 0 refills | Status: AC

## 2018-05-13 MED ORDER — OXYCODONE-ACETAMINOPHEN 5-325 MG PO TABS: 1.0000 | ORAL_TABLET | Freq: Three times a day (TID) | ORAL | 0 refills | Status: AC | PRN

## 2018-05-13 MED ORDER — ACETAMINOPHEN 500 MG PO TABS
1000.0000 mg | ORAL_TABLET | Freq: Once | ORAL | Status: AC
  Administered 2018-05-13: 1000 mg via ORAL
  Filled 2018-05-13: qty 2

## 2018-05-13 MED ORDER — HYDROMORPHONE HCL 1 MG/ML IJ SOLN
1.0000 mg | Freq: Once | INTRAMUSCULAR | Status: AC
  Administered 2018-05-13: 1 mg via INTRAVENOUS
  Filled 2018-05-13: qty 1

## 2018-05-14 ENCOUNTER — Encounter: Payer: Self-pay | Admitting: Gastroenterology

## 2019-08-25 ENCOUNTER — Ambulatory Visit: Payer: BLUE CROSS/BLUE SHIELD

## 2020-03-24 ENCOUNTER — Other Ambulatory Visit: Payer: Self-pay

## 2020-04-01 ENCOUNTER — Other Ambulatory Visit: Payer: Self-pay

## 2020-04-01 DIAGNOSIS — Z20822 Contact with and (suspected) exposure to covid-19: Secondary | ICD-10-CM

## 2020-04-03 LAB — SARS-COV-2, NAA 2 DAY TAT

## 2020-04-03 LAB — NOVEL CORONAVIRUS, NAA: SARS-CoV-2, NAA: NOT DETECTED

## 2020-06-27 ENCOUNTER — Other Ambulatory Visit: Payer: BC Managed Care – PPO

## 2020-06-27 DIAGNOSIS — Z20822 Contact with and (suspected) exposure to covid-19: Secondary | ICD-10-CM

## 2020-06-29 LAB — SARS-COV-2, NAA 2 DAY TAT

## 2020-06-29 LAB — NOVEL CORONAVIRUS, NAA: SARS-CoV-2, NAA: NOT DETECTED

## 2020-07-21 ENCOUNTER — Other Ambulatory Visit: Payer: Self-pay

## 2020-07-21 DIAGNOSIS — Z20822 Contact with and (suspected) exposure to covid-19: Secondary | ICD-10-CM

## 2020-07-22 LAB — SARS-COV-2, NAA 2 DAY TAT

## 2020-07-22 LAB — NOVEL CORONAVIRUS, NAA: SARS-CoV-2, NAA: NOT DETECTED

## 2021-05-12 ENCOUNTER — Other Ambulatory Visit: Payer: Self-pay

## 2021-05-12 ENCOUNTER — Ambulatory Visit: Admission: RE | Admit: 2021-05-12 | Payer: 59 | Source: Ambulatory Visit

## 2021-05-12 ENCOUNTER — Other Ambulatory Visit: Payer: Self-pay | Admitting: Internal Medicine

## 2021-05-12 ENCOUNTER — Ambulatory Visit
Admission: RE | Admit: 2021-05-12 | Discharge: 2021-05-12 | Disposition: A | Discharge status: Home / Self Care | Payer: 59 | Source: Ambulatory Visit | Attending: Internal Medicine | Admitting: Internal Medicine

## 2021-05-12 DIAGNOSIS — R519 Headache, unspecified: Secondary | ICD-10-CM

## 2021-05-12 MED ORDER — IOPAMIDOL (ISOVUE-370) INJECTION 76%
75.0000 mL | Freq: Once | INTRAVENOUS | Status: AC | PRN
  Administered 2021-05-12: 75 mL via INTRAVENOUS

## 2021-05-15 ENCOUNTER — Other Ambulatory Visit: Payer: Self-pay | Admitting: Internal Medicine

## 2021-05-15 DIAGNOSIS — G9389 Other specified disorders of brain: Secondary | ICD-10-CM

## 2021-06-05 ENCOUNTER — Other Ambulatory Visit: Payer: Self-pay

## 2021-06-05 ENCOUNTER — Ambulatory Visit
Admission: RE | Admit: 2021-06-05 | Discharge: 2021-06-05 | Disposition: A | Discharge status: Home / Self Care | Payer: 59 | Source: Ambulatory Visit | Attending: Internal Medicine | Admitting: Internal Medicine

## 2021-06-05 DIAGNOSIS — G9389 Other specified disorders of brain: Secondary | ICD-10-CM

## 2021-06-05 MED ORDER — GADOBENATE DIMEGLUMINE 529 MG/ML IV SOLN
18.0000 mL | Freq: Once | INTRAVENOUS | Status: AC | PRN
  Administered 2021-06-05: 18 mL via INTRAVENOUS

## 2021-10-26 DIAGNOSIS — R051 Acute cough: Secondary | ICD-10-CM | POA: Diagnosis not present

## 2021-10-26 DIAGNOSIS — Z1152 Encounter for screening for COVID-19: Secondary | ICD-10-CM | POA: Diagnosis not present

## 2021-10-26 DIAGNOSIS — R5383 Other fatigue: Secondary | ICD-10-CM | POA: Diagnosis not present

## 2021-10-26 DIAGNOSIS — R0981 Nasal congestion: Secondary | ICD-10-CM | POA: Diagnosis not present

## 2021-10-26 DIAGNOSIS — J069 Acute upper respiratory infection, unspecified: Secondary | ICD-10-CM | POA: Diagnosis not present

## 2021-11-17 DIAGNOSIS — H9313 Tinnitus, bilateral: Secondary | ICD-10-CM | POA: Diagnosis not present

## 2021-11-17 DIAGNOSIS — H903 Sensorineural hearing loss, bilateral: Secondary | ICD-10-CM | POA: Diagnosis not present

## 2021-11-22 ENCOUNTER — Other Ambulatory Visit: Payer: Self-pay | Admitting: Neurosurgery

## 2021-11-22 DIAGNOSIS — D320 Benign neoplasm of cerebral meninges: Secondary | ICD-10-CM

## 2021-12-09 DIAGNOSIS — D225 Melanocytic nevi of trunk: Secondary | ICD-10-CM | POA: Diagnosis not present

## 2021-12-17 ENCOUNTER — Ambulatory Visit
Admission: RE | Admit: 2021-12-17 | Discharge: 2021-12-17 | Disposition: A | Discharge status: Home / Self Care | Payer: 59 | Source: Ambulatory Visit | Attending: Neurosurgery | Admitting: Neurosurgery

## 2021-12-17 DIAGNOSIS — D320 Benign neoplasm of cerebral meninges: Secondary | ICD-10-CM

## 2021-12-17 DIAGNOSIS — D329 Benign neoplasm of meninges, unspecified: Secondary | ICD-10-CM | POA: Diagnosis not present

## 2021-12-17 MED ORDER — GADOBENATE DIMEGLUMINE 529 MG/ML IV SOLN
17.0000 mL | Freq: Once | INTRAVENOUS | Status: AC | PRN
  Administered 2021-12-17: 17 mL via INTRAVENOUS

## 2022-01-06 DIAGNOSIS — D320 Benign neoplasm of cerebral meninges: Secondary | ICD-10-CM | POA: Diagnosis not present

## 2022-01-06 DIAGNOSIS — E785 Hyperlipidemia, unspecified: Secondary | ICD-10-CM | POA: Diagnosis not present

## 2022-01-06 DIAGNOSIS — R7301 Impaired fasting glucose: Secondary | ICD-10-CM | POA: Diagnosis not present

## 2022-01-06 DIAGNOSIS — Z125 Encounter for screening for malignant neoplasm of prostate: Secondary | ICD-10-CM | POA: Diagnosis not present

## 2022-01-06 DIAGNOSIS — R7989 Other specified abnormal findings of blood chemistry: Secondary | ICD-10-CM | POA: Diagnosis not present

## 2022-01-13 ENCOUNTER — Other Ambulatory Visit: Payer: Self-pay | Admitting: Internal Medicine

## 2022-01-13 DIAGNOSIS — R82998 Other abnormal findings in urine: Secondary | ICD-10-CM | POA: Diagnosis not present

## 2022-01-13 DIAGNOSIS — E785 Hyperlipidemia, unspecified: Secondary | ICD-10-CM

## 2022-03-16 ENCOUNTER — Ambulatory Visit
Admission: RE | Admit: 2022-03-16 | Discharge: 2022-03-16 | Disposition: A | Discharge status: Home / Self Care | Payer: No Typology Code available for payment source | Source: Ambulatory Visit | Attending: Internal Medicine | Admitting: Internal Medicine

## 2022-03-16 DIAGNOSIS — E785 Hyperlipidemia, unspecified: Secondary | ICD-10-CM

## 2022-06-20 ENCOUNTER — Other Ambulatory Visit: Payer: Self-pay | Admitting: Internal Medicine

## 2022-06-20 DIAGNOSIS — R918 Other nonspecific abnormal finding of lung field: Secondary | ICD-10-CM

## 2022-09-19 ENCOUNTER — Encounter: Payer: Self-pay | Admitting: Internal Medicine

## 2022-09-21 ENCOUNTER — Ambulatory Visit
Admission: RE | Admit: 2022-09-21 | Discharge: 2022-09-21 | Disposition: A | Discharge status: Home / Self Care | Payer: BC Managed Care – PPO | Source: Ambulatory Visit | Attending: Internal Medicine | Admitting: Internal Medicine

## 2022-09-21 DIAGNOSIS — R918 Other nonspecific abnormal finding of lung field: Secondary | ICD-10-CM

## 2022-12-14 ENCOUNTER — Encounter: Payer: Self-pay | Admitting: Gastroenterology

## 2023-01-04 ENCOUNTER — Other Ambulatory Visit: Payer: Self-pay | Admitting: Neurosurgery

## 2023-01-04 DIAGNOSIS — D320 Benign neoplasm of cerebral meninges: Secondary | ICD-10-CM

## 2023-02-02 ENCOUNTER — Ambulatory Visit
Admission: RE | Admit: 2023-02-02 | Discharge: 2023-02-02 | Disposition: A | Discharge status: Home / Self Care | Payer: BC Managed Care – PPO | Source: Ambulatory Visit | Attending: Neurosurgery | Admitting: Neurosurgery

## 2023-02-02 DIAGNOSIS — D320 Benign neoplasm of cerebral meninges: Secondary | ICD-10-CM

## 2023-02-02 MED ORDER — GADOPICLENOL 0.5 MMOL/ML IV SOLN
8.0000 mL | Freq: Once | INTRAVENOUS | Status: AC | PRN
  Administered 2023-02-02: 8 mL via INTRAVENOUS

## 2023-02-16 ENCOUNTER — Other Ambulatory Visit: Payer: BC Managed Care – PPO
